# Patient Record
Sex: Male | Born: 1965 | Race: White | Hispanic: No | Marital: Married | Smoking: Never smoker
Health system: Southern US, Community
[De-identification: ages and names within clinical notes are randomized; demographics above are authoritative.]

## PROBLEM LIST (undated history)

## (undated) DIAGNOSIS — G43909 Migraine, unspecified, not intractable, without status migrainosus: Secondary | ICD-10-CM

## (undated) DIAGNOSIS — N289 Disorder of kidney and ureter, unspecified: Secondary | ICD-10-CM

## (undated) HISTORY — PX: HERNIA REPAIR: SHX51

## (undated) HISTORY — PX: CHOLECYSTECTOMY: SHX55

## (undated) HISTORY — PX: GASTRIC BYPASS: SHX52

---

## 2004-12-06 ENCOUNTER — Emergency Department (HOSPITAL_COMMUNITY): Admission: EM | Admit: 2004-12-06 | Discharge: 2004-12-06 | Payer: Self-pay | Admitting: Emergency Medicine

## 2005-12-09 ENCOUNTER — Ambulatory Visit (HOSPITAL_COMMUNITY): Admission: RE | Admit: 2005-12-09 | Discharge: 2005-12-09 | Payer: Self-pay | Admitting: Family Medicine

## 2005-12-21 ENCOUNTER — Ambulatory Visit (HOSPITAL_COMMUNITY): Admission: RE | Admit: 2005-12-21 | Discharge: 2005-12-21 | Payer: Self-pay | Admitting: Family Medicine

## 2006-06-27 ENCOUNTER — Emergency Department (HOSPITAL_COMMUNITY): Admission: EM | Admit: 2006-06-27 | Discharge: 2006-06-27 | Payer: Self-pay | Admitting: Family Medicine

## 2007-12-19 ENCOUNTER — Emergency Department (HOSPITAL_COMMUNITY): Admission: EM | Admit: 2007-12-19 | Discharge: 2007-12-19 | Payer: Self-pay | Admitting: Emergency Medicine

## 2008-04-04 ENCOUNTER — Emergency Department (HOSPITAL_COMMUNITY): Admission: EM | Admit: 2008-04-04 | Discharge: 2008-04-04 | Payer: Self-pay | Admitting: Emergency Medicine

## 2008-04-09 ENCOUNTER — Ambulatory Visit (HOSPITAL_COMMUNITY): Admission: RE | Admit: 2008-04-09 | Discharge: 2008-04-09 | Payer: Self-pay | Admitting: Family Medicine

## 2010-09-12 ENCOUNTER — Emergency Department (HOSPITAL_COMMUNITY): Admission: EM | Admit: 2010-09-12 | Discharge: 2010-09-12 | Payer: Self-pay | Admitting: Emergency Medicine

## 2011-01-26 LAB — URINALYSIS, ROUTINE W REFLEX MICROSCOPIC
Bilirubin Urine: NEGATIVE
Glucose, UA: NEGATIVE mg/dL
Hgb urine dipstick: NEGATIVE
Ketones, ur: NEGATIVE mg/dL
Nitrite: NEGATIVE
Protein, ur: NEGATIVE mg/dL
Specific Gravity, Urine: 1.015 (ref 1.005–1.030)
Urobilinogen, UA: 0.2 mg/dL (ref 0.0–1.0)
pH: 7 (ref 5.0–8.0)

## 2011-01-26 LAB — CBC
HCT: 44.2 % (ref 39.0–52.0)
Hemoglobin: 14.5 g/dL (ref 13.0–17.0)
MCH: 30.9 pg (ref 26.0–34.0)
MCHC: 32.8 g/dL (ref 30.0–36.0)
MCV: 94.1 fL (ref 78.0–100.0)
Platelets: 232 10*3/uL (ref 150–400)
RBC: 4.69 MIL/uL (ref 4.22–5.81)
RDW: 13.5 % (ref 11.5–15.5)
WBC: 6.8 10*3/uL (ref 4.0–10.5)

## 2011-01-26 LAB — DIFFERENTIAL
Basophils Absolute: 0.1 10*3/uL (ref 0.0–0.1)
Basophils Relative: 1 % (ref 0–1)
Eosinophils Absolute: 0.1 10*3/uL (ref 0.0–0.7)
Eosinophils Relative: 2 % (ref 0–5)
Lymphocytes Relative: 44 % (ref 12–46)
Lymphs Abs: 3 10*3/uL (ref 0.7–4.0)
Monocytes Absolute: 0.7 10*3/uL (ref 0.1–1.0)
Monocytes Relative: 10 % (ref 3–12)
Neutro Abs: 3 10*3/uL (ref 1.7–7.7)
Neutrophils Relative %: 43 % (ref 43–77)

## 2011-01-26 LAB — BASIC METABOLIC PANEL
BUN: 11 mg/dL (ref 6–23)
CO2: 27 mEq/L (ref 19–32)
Calcium: 9.6 mg/dL (ref 8.4–10.5)
Chloride: 97 mEq/L (ref 96–112)
Creatinine, Ser: 1.03 mg/dL (ref 0.4–1.5)
GFR calc Af Amer: >60 mL/min (ref >60)
GFR calc non Af Amer: >60 mL/min (ref >60)
Glucose, Bld: 112 mg/dL — ABNORMAL HIGH (ref 70–99)
Potassium: 3.8 mEq/L (ref 3.5–5.1)
Sodium: 132 mEq/L — ABNORMAL LOW (ref 135–145)

## 2011-08-10 LAB — DIFFERENTIAL
Basophils Absolute: 0
Basophils Relative: 0
Eosinophils Absolute: 0.1
Eosinophils Relative: 1
Lymphocytes Relative: 20
Lymphs Abs: 2.9
Monocytes Absolute: 1
Monocytes Relative: 7
Neutro Abs: 10.3 — ABNORMAL HIGH
Neutrophils Relative %: 72

## 2011-08-10 LAB — CBC
HCT: 37.2 — ABNORMAL LOW
Hemoglobin: 13.1
MCHC: 35.3
MCV: 88.8
Platelets: 254
RBC: 4.19 — ABNORMAL LOW
RDW: 12.9
WBC: 14.3 — ABNORMAL HIGH

## 2011-08-10 LAB — BASIC METABOLIC PANEL
BUN: 15
CO2: 24
Calcium: 9
Chloride: 104
Creatinine, Ser: 0.94
GFR calc Af Amer: >60
GFR calc non Af Amer: >60
Glucose, Bld: 144 — ABNORMAL HIGH
Potassium: 3.4 — ABNORMAL LOW
Sodium: 135

## 2012-01-30 ENCOUNTER — Encounter (HOSPITAL_COMMUNITY): Payer: Self-pay | Admitting: Emergency Medicine

## 2012-01-30 ENCOUNTER — Emergency Department (HOSPITAL_COMMUNITY): Payer: 59

## 2012-01-30 ENCOUNTER — Emergency Department (HOSPITAL_COMMUNITY)
Admission: EM | Admit: 2012-01-30 | Discharge: 2012-01-30 | Disposition: A | Discharge status: Home / Self Care | Payer: 59 | Attending: Emergency Medicine | Admitting: Emergency Medicine

## 2012-01-30 ENCOUNTER — Other Ambulatory Visit: Payer: Self-pay

## 2012-01-30 DIAGNOSIS — R4789 Other speech disturbances: Secondary | ICD-10-CM | POA: Insufficient documentation

## 2012-01-30 DIAGNOSIS — Z9884 Bariatric surgery status: Secondary | ICD-10-CM | POA: Insufficient documentation

## 2012-01-30 DIAGNOSIS — R4701 Aphasia: Secondary | ICD-10-CM | POA: Insufficient documentation

## 2012-01-30 DIAGNOSIS — R209 Unspecified disturbances of skin sensation: Secondary | ICD-10-CM | POA: Insufficient documentation

## 2012-01-30 DIAGNOSIS — G43909 Migraine, unspecified, not intractable, without status migrainosus: Secondary | ICD-10-CM | POA: Insufficient documentation

## 2012-01-30 DIAGNOSIS — H547 Unspecified visual loss: Secondary | ICD-10-CM | POA: Insufficient documentation

## 2012-01-30 HISTORY — DX: Migraine, unspecified, not intractable, without status migrainosus: G43.909

## 2012-01-30 HISTORY — DX: Disorder of kidney and ureter, unspecified: N28.9

## 2012-01-30 LAB — DIFFERENTIAL
Basophils Absolute: 0 10*3/uL (ref 0.0–0.1)
Basophils Relative: 1 % (ref 0–1)
Eosinophils Absolute: 0.1 10*3/uL (ref 0.0–0.7)
Eosinophils Relative: 1 % (ref 0–5)
Lymphs Abs: 2.5 10*3/uL (ref 0.7–4.0)
Monocytes Absolute: 0.6 10*3/uL (ref 0.1–1.0)
Monocytes Relative: 12 % (ref 3–12)
Neutro Abs: 2.3 10*3/uL (ref 1.7–7.7)
Neutrophils Relative %: 42 % — ABNORMAL LOW (ref 43–77)

## 2012-01-30 LAB — BASIC METABOLIC PANEL
BUN: 9 mg/dL (ref 6–23)
CO2: 29 mEq/L (ref 19–32)
Calcium: 9.3 mg/dL (ref 8.4–10.5)
Chloride: 105 mEq/L (ref 96–112)
Creatinine, Ser: 0.9 mg/dL (ref 0.50–1.35)
GFR calc non Af Amer: >90 mL/min (ref >90)
Glucose, Bld: 100 mg/dL — ABNORMAL HIGH (ref 70–99)
Potassium: 3.4 mEq/L — ABNORMAL LOW (ref 3.5–5.1)
Sodium: 140 mEq/L (ref 135–145)

## 2012-01-30 LAB — CBC
HCT: 39 % (ref 39.0–52.0)
Hemoglobin: 13.4 g/dL (ref 13.0–17.0)
MCH: 31.1 pg (ref 26.0–34.0)
MCHC: 34.4 g/dL (ref 30.0–36.0)
MCV: 90.5 fL (ref 78.0–100.0)
Platelets: 199 10*3/uL (ref 150–400)
RBC: 4.31 MIL/uL (ref 4.22–5.81)
RDW: 12.5 % (ref 11.5–15.5)
WBC: 5.6 10*3/uL (ref 4.0–10.5)

## 2012-01-30 MED ORDER — SODIUM CHLORIDE 0.9 % IV BOLUS (SEPSIS)
1000.0000 mL | Freq: Once | INTRAVENOUS | Status: AC
  Administered 2012-01-30: 1000 mL via INTRAVENOUS

## 2012-01-30 MED ORDER — PROMETHAZINE HCL 25 MG/ML IJ SOLN
12.5000 mg | Freq: Once | INTRAMUSCULAR | Status: AC
  Administered 2012-01-30: 12.5 mg via INTRAVENOUS
  Filled 2012-01-30: qty 1

## 2012-01-30 MED ORDER — SODIUM CHLORIDE 0.9 % IV SOLN
INTRAVENOUS | Status: DC
  Administered 2012-01-30: 16:00:00 via INTRAVENOUS

## 2012-01-30 MED ORDER — HYDROMORPHONE HCL PF 1 MG/ML IJ SOLN
1.0000 mg | Freq: Once | INTRAMUSCULAR | Status: AC
  Administered 2012-01-30: 1 mg via INTRAVENOUS
  Filled 2012-01-30: qty 1

## 2012-01-30 NOTE — Discharge Instructions (Signed)
Migraine Headache  A migraine is very bad pain on one or both sides of your head. The cause of a migraine is not always known. A migraine can be triggered or caused by different things, such as:   Alcohol.   Smoking.   Stress.   Periods (menstruation) in women.   Aged cheeses.   Foods or drinks that contain nitrates, glutamate, aspartame, or tyramine.   Lack of sleep.   Chocolate.   Caffeine.   Hunger.   Medicines, such as nitroglycerine (used to treat chest pain), birth control pills, estrogen, and some blood pressure medicines.  HOME CARE   Many medicines can help migraine pain or keep migraines from coming back. Your doctor can help you decide on a medicine or treatment program.   If you or your child gets a migraine, it may help to lie down in a dark, quiet room.   Keep a headache journal. This may help find out what is causing the headaches. For example, write down:   What you eat and drink.   How much sleep you get.   Any change to your diet or medicines.  GET HELP RIGHT AWAY IF:    The medicine does not work.   The pain begins again.   The neck is stiff.   You have trouble seeing.   The muscles are weak or you lose muscle control.   You have new symptoms.   You lose your balance.   You have trouble walking.   You feel faint or pass out.  MAKE SURE YOU:    Understand these instructions.   Will watch this condition.   Will get help right away if you are not doing well or get worse.  Document Released: 08/09/2008 Document Revised: 10/20/2011 Document Reviewed: 07/06/2009  [employer] Patient Information 2012 [employer].

## 2012-01-30 NOTE — ED Notes (Signed)
Pt c/o headache with numbness to hands, no peripheral vision, and slurred speech since 1030 today. Pt states he has a history of migraines.

## 2012-01-30 NOTE — ED Provider Notes (Signed)
History   This chart was scribed for Shelda Jakes, MD by Sofie Rower. The patient was seen in room APA19/APA19 and the patient's care was started at 3:41PM.    CSN: [id number]  Arrival date & time 01/30/12  1358   First MD Initiated Contact with Patient 01/30/12 1515      Chief Complaint  Patient presents with  . Numbness  . Headache  . Aphasia    (Consider location/radiation/quality/duration/timing/severity/associated sxs/prior treatment) HPI  [name] [name] is a 46 y.o. male who presents to the Emergency Department complaining of moderate, constant headache located on the left side back of head, onset today (10:30AM) with associated symptoms of numbness to the hands, slurred speech. Pt states " his hands began to tingle, he lost vision and the left side of his head started pounding". Modifying factors include taking dilaudid (which he was given last episode), morphine with relief. Pt has a hx of migraines (1987) went to the Neurologist in [place], [place]. Pt states he experiences similar symptoms like these which happen 2-3 times a year. 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   Pt denies inability to think clearly, nausea, vomiting, fever, back pain, chest pain, abd pain, shortness of breath, sore throat, diarrhea, dysuria, rash, swelling in the legs.   Pt is allergic to codeine.   PCP is Dr. Phillips Odor. Pt does not have a nuero at present.   Past Medical History  Diagnosis Date  . Renal disorder   . Migraines     Past Surgical History  Procedure Date  . Gastric bypass     History reviewed. No pertinent family history.  History  Substance Use Topics  . Smoking status: Never Smoker   . Smokeless tobacco: Not on file  . Alcohol Use: Yes      Review of Systems  All other systems reviewed and are negative.    10 Systems reviewed and are negative for acute change except as noted in the HPI.   Allergies  Codeine  Home Medications   Current Outpatient  Rx  Name Route Sig Dispense Refill  . CALCIUM PO Oral Take 1 tablet by mouth 2 (two) times daily.    . CYANOCOBALAMIN 1000 MCG SL SUBL Sublingual Place 2 tablets under the tongue daily.    . IRON PO Oral Take 1 tablet by mouth daily.    . ADULT MULTIVITAMIN W/MINERALS CH Oral Take 1 tablet by mouth daily.      BP 129/76  Pulse 43  Resp 21  Ht 5\' 7"  (1.702 m)  Wt 179 lb (81.194 kg)  BMI 28.04 kg/m2  SpO2 100%  Physical Exam  Nursing note and vitals reviewed. Constitutional: He is oriented to person, place, and time. He appears well-developed and well-nourished.  HENT:  Head: Normocephalic and atraumatic.  Nose: Nose normal.  Eyes: Conjunctivae are normal. Right eye exhibits no discharge. Left eye exhibits no discharge. No scleral icterus.       Pupils 3 cm symmetrical.   Neck: Neck supple. No thyromegaly present.  Cardiovascular: Normal rate, regular rhythm and normal heart sounds.  Exam reveals no gallop and no friction rub.   No murmur heard. Pulmonary/Chest: Breath sounds normal. No stridor. He has no wheezes. He has no rales. He exhibits no tenderness.  Abdominal: Bowel sounds are normal. He exhibits no distension. There is no tenderness. There is no rebound and no guarding.  Musculoskeletal: Normal range of motion. He exhibits no edema.  Lymphadenopathy:  He has no cervical adenopathy.  Neurological: He is alert and oriented to person, place, and time. Coordination normal.  Skin: Skin is warm and dry. No rash noted. No erythema.  Psychiatric: He has a normal mood and affect. His behavior is normal.    ED Course  Procedures (including critical care time)  DIAGNOSTIC STUDIES: Oxygen Saturation is 100% on room air, normal by my interpretation.    COORDINATION OF CARE:  Results for orders placed during the hospital encounter of 01/30/12  BASIC METABOLIC PANEL      Component Value Range   Sodium 140  135 - 145 (mEq/L)   Potassium 3.4 (*) 3.5 - 5.1 (mEq/L)   Chloride  105  96 - 112 (mEq/L)   CO2 29  19 - 32 (mEq/L)   Glucose, Bld 100 (*) 70 - 99 (mg/dL)   BUN 9  6 - 23 (mg/dL)   Creatinine, Ser 1.61  0.50 - 1.35 (mg/dL)   Calcium 9.3  8.4 - 09.6 (mg/dL)   GFR calc non Af Amer >90  >90 (mL/min)   GFR calc Af Amer >90  >90 (mL/min)  CBC      Component Value Range   WBC 5.6  4.0 - 10.5 (K/uL)   RBC 4.31  4.22 - 5.81 (MIL/uL)   Hemoglobin 13.4  13.0 - 17.0 (g/dL)   HCT 04.5  40.9 - 81.1 (%)   MCV 90.5  78.0 - 100.0 (fL)   MCH 31.1  26.0 - 34.0 (pg)   MCHC 34.4  30.0 - 36.0 (g/dL)   RDW 91.4  78.2 - 95.6 (%)   Platelets 199  150 - 400 (K/uL)  DIFFERENTIAL      Component Value Range   Neutrophils Relative 42 (*) 43 - 77 (%)   Neutro Abs 2.3  1.7 - 7.7 (K/uL)   Lymphocytes Relative 45  12 - 46 (%)   Lymphs Abs 2.5  0.7 - 4.0 (K/uL)   Monocytes Relative 12  3 - 12 (%)   Monocytes Absolute 0.6  0.1 - 1.0 (K/uL)   Eosinophils Relative 1  0 - 5 (%)   Eosinophils Absolute 0.1  0.0 - 0.7 (K/uL)   Basophils Relative 1  0 - 1 (%)   Basophils Absolute 0.0  0.0 - 0.1 (K/uL)   Ct Head Wo Contrast  01/30/2012  *RADIOLOGY REPORT*  Clinical Data:  Migraine headaches since 10:30 a.m. Photophobia.  CT HEAD WITHOUT CONTRAST  Technique:  Contiguous axial images were obtained from the base of the skull through the vertex without contrast  Comparison:  None.  Findings:  The brain has a normal appearance without evidence for hemorrhage, acute infarction, hydrocephalus, or mass lesion.  There is no extra axial fluid collection.  The skull and paranasal sinuses are normal.  IMPRESSION: Normal CT of the head without contrast.  Original Report Authenticated By: Elsie Stain, M.D.     Labs Reviewed  BASIC METABOLIC PANEL - Abnormal; Notable for the following:    Potassium 3.4 (*)    Glucose, Bld 100 (*)    All other components within normal limits  DIFFERENTIAL - Abnormal; Notable for the following:    Neutrophils Relative 42 (*)    All other components within normal  limits  CBC   Ct Head Wo Contrast  01/30/2012  *RADIOLOGY REPORT*  Clinical Data:  Migraine headaches since 10:30 a.m. Photophobia.  CT HEAD WITHOUT CONTRAST  Technique:  Contiguous axial images were obtained from the base of  the skull through the vertex without contrast  Comparison:  None.  Findings:  The brain has a normal appearance without evidence for hemorrhage, acute infarction, hydrocephalus, or mass lesion.  There is no extra axial fluid collection.  The skull and paranasal sinuses are normal.  IMPRESSION: Normal CT of the head without contrast.  Original Report Authenticated By: Elsie Stain, M.D.   Results for orders placed during the hospital encounter of 01/30/12  BASIC METABOLIC PANEL      Component Value Range   Sodium 140  135 - 145 (mEq/L)   Potassium 3.4 (*) 3.5 - 5.1 (mEq/L)   Chloride 105  96 - 112 (mEq/L)   CO2 29  19 - 32 (mEq/L)   Glucose, Bld 100 (*) 70 - 99 (mg/dL)   BUN 9  6 - 23 (mg/dL)   Creatinine, Ser 1.61  0.50 - 1.35 (mg/dL)   Calcium 9.3  8.4 - 09.6 (mg/dL)   GFR calc non Af Amer >90  >90 (mL/min)   GFR calc Af Amer >90  >90 (mL/min)  CBC      Component Value Range   WBC 5.6  4.0 - 10.5 (K/uL)   RBC 4.31  4.22 - 5.81 (MIL/uL)   Hemoglobin 13.4  13.0 - 17.0 (g/dL)   HCT 04.5  40.9 - 81.1 (%)   MCV 90.5  78.0 - 100.0 (fL)   MCH 31.1  26.0 - 34.0 (pg)   MCHC 34.4  30.0 - 36.0 (g/dL)   RDW 91.4  78.2 - 95.6 (%)   Platelets 199  150 - 400 (K/uL)  DIFFERENTIAL      Component Value Range   Neutrophils Relative 42 (*) 43 - 77 (%)   Neutro Abs 2.3  1.7 - 7.7 (K/uL)   Lymphocytes Relative 45  12 - 46 (%)   Lymphs Abs 2.5  0.7 - 4.0 (K/uL)   Monocytes Relative 12  3 - 12 (%)   Monocytes Absolute 0.6  0.1 - 1.0 (K/uL)   Eosinophils Relative 1  0 - 5 (%)   Eosinophils Absolute 0.1  0.0 - 0.7 (K/uL)   Basophils Relative 1  0 - 1 (%)   Basophils Absolute 0.0  0.0 - 0.1 (K/uL)    Date: 01/30/2012  Rate: 41  Rhythm: sinus bradycardia  QRS Axis: normal   Intervals: normal  ST/T Wave abnormalities: nonspecific T wave changes  Conduction Disutrbances:none  Narrative Interpretation:   Old EKG Reviewed: none available    1. Migraine     3:41PM- EDP at bedside discusses treatment plan concerning complicated migraines and cat scan, IV fluids, pain management, anti nausea medication.   5:22PM- Recheck. EDP at bedside, pt reports "headache has been relieved".   MDM   History date of migraines today's migraine is typical for his migraines including the focal findings. Head CT was done just to be cautious it was negative without any acute findings basic labs without any significant abnormalities. Migraine headache completely relieved with Phenergan and Dilaudid IV patient feeling much better will discharge home and give referral to neurology for further followup as migraine headaches.     I personally performed the services described in this documentation, which was scribed in my presence. The recorded information has been reviewed and considered.     Shelda Jakes, MD 01/30/12 [phone]

## 2012-03-29 ENCOUNTER — Emergency Department (HOSPITAL_COMMUNITY): Payer: 59

## 2012-03-29 ENCOUNTER — Emergency Department (HOSPITAL_COMMUNITY)
Admission: EM | Admit: 2012-03-29 | Discharge: 2012-03-29 | Disposition: A | Discharge status: Home / Self Care | Payer: 59 | Attending: Emergency Medicine | Admitting: Emergency Medicine

## 2012-03-29 ENCOUNTER — Encounter (HOSPITAL_COMMUNITY): Payer: Self-pay | Admitting: *Deleted

## 2012-03-29 DIAGNOSIS — Z9884 Bariatric surgery status: Secondary | ICD-10-CM | POA: Insufficient documentation

## 2012-03-29 DIAGNOSIS — R1032 Left lower quadrant pain: Secondary | ICD-10-CM | POA: Insufficient documentation

## 2012-03-29 DIAGNOSIS — R1031 Right lower quadrant pain: Secondary | ICD-10-CM | POA: Insufficient documentation

## 2012-03-29 DIAGNOSIS — R112 Nausea with vomiting, unspecified: Secondary | ICD-10-CM | POA: Insufficient documentation

## 2012-03-29 DIAGNOSIS — R1012 Left upper quadrant pain: Secondary | ICD-10-CM | POA: Insufficient documentation

## 2012-03-29 DIAGNOSIS — R1011 Right upper quadrant pain: Secondary | ICD-10-CM | POA: Insufficient documentation

## 2012-03-29 DIAGNOSIS — M549 Dorsalgia, unspecified: Secondary | ICD-10-CM | POA: Insufficient documentation

## 2012-03-29 LAB — COMPREHENSIVE METABOLIC PANEL
ALT: 26 U/L (ref 0–53)
Alkaline Phosphatase: 108 U/L (ref 39–117)
CO2: 21 mEq/L (ref 19–32)
Chloride: 98 mEq/L (ref 96–112)
GFR calc Af Amer: >90 mL/min (ref >90)
GFR calc non Af Amer: >90 mL/min (ref >90)
Glucose, Bld: 125 mg/dL — ABNORMAL HIGH (ref 70–99)
Potassium: 3.6 mEq/L (ref 3.5–5.1)
Sodium: 137 mEq/L (ref 135–145)

## 2012-03-29 LAB — CBC
MCV: 89.4 fL (ref 78.0–100.0)
Platelets: 244 10*3/uL (ref 150–400)
RBC: 4.83 MIL/uL (ref 4.22–5.81)
WBC: 7.1 10*3/uL (ref 4.0–10.5)

## 2012-03-29 LAB — URINALYSIS, ROUTINE W REFLEX MICROSCOPIC
Hgb urine dipstick: NEGATIVE
Nitrite: NEGATIVE
Specific Gravity, Urine: 1.02 (ref 1.005–1.030)
Urobilinogen, UA: 0.2 mg/dL (ref 0.0–1.0)

## 2012-03-29 LAB — DIFFERENTIAL
Lymphocytes Relative: 42 % (ref 12–46)
Lymphs Abs: 3 10*3/uL (ref 0.7–4.0)
Neutrophils Relative %: 48 % (ref 43–77)

## 2012-03-29 MED ORDER — FENTANYL CITRATE 0.05 MG/ML IJ SOLN
INTRAMUSCULAR | Status: AC
  Administered 2012-03-29: 100 ug
  Filled 2012-03-29: qty 2

## 2012-03-29 MED ORDER — HYDROMORPHONE HCL PF 1 MG/ML IJ SOLN
1.0000 mg | INTRAMUSCULAR | Status: DC | PRN
  Administered 2012-03-29: 1 mg via INTRAVENOUS
  Filled 2012-03-29: qty 1

## 2012-03-29 MED ORDER — OXYCODONE-ACETAMINOPHEN 5-325 MG PO TABS: 1.0000 | ORAL_TABLET | ORAL | Status: DC | PRN

## 2012-03-29 MED ORDER — FENTANYL CITRATE 0.05 MG/ML IJ SOLN
100.0000 ug | Freq: Once | INTRAMUSCULAR | Status: AC
  Administered 2012-03-29: 100 ug via INTRAVENOUS
  Filled 2012-03-29: qty 2

## 2012-03-29 MED ORDER — SODIUM CHLORIDE 0.9 % IV BOLUS (SEPSIS)
1000.0000 mL | Freq: Once | INTRAVENOUS | Status: AC
  Administered 2012-03-29: 1000 mL via INTRAVENOUS

## 2012-03-29 MED ORDER — ONDANSETRON HCL 4 MG/2ML IJ SOLN
INTRAMUSCULAR | Status: AC
  Filled 2012-03-29: qty 2

## 2012-03-29 MED ORDER — OXYCODONE-ACETAMINOPHEN 5-325 MG PO TABS: 1.0000 | ORAL_TABLET | ORAL | Status: AC | PRN

## 2012-03-29 MED ORDER — ONDANSETRON HCL 4 MG/2ML IJ SOLN
4.0000 mg | Freq: Once | INTRAMUSCULAR | Status: AC
  Administered 2012-03-29: 4 mg via INTRAVENOUS

## 2012-03-29 MED ORDER — SODIUM CHLORIDE 0.9 % IV BOLUS (SEPSIS): 1000.0000 mL | Freq: Once | INTRAVENOUS | Status: DC

## 2012-03-29 MED ORDER — HYDROMORPHONE HCL PF 1 MG/ML IJ SOLN
1.0000 mg | Freq: Once | INTRAMUSCULAR | Status: AC
  Administered 2012-03-29: 1 mg via INTRAVENOUS
  Filled 2012-03-29: qty 1

## 2012-03-29 MED ORDER — ONDANSETRON HCL 4 MG/2ML IJ SOLN
4.0000 mg | Freq: Once | INTRAMUSCULAR | Status: AC
  Administered 2012-03-29: 4 mg via INTRAVENOUS
  Filled 2012-03-29: qty 2

## 2012-03-29 MED ORDER — ONDANSETRON HCL 4 MG/2ML IJ SOLN
INTRAMUSCULAR | Status: AC
  Administered 2012-03-29: 4 mg
  Filled 2012-03-29: qty 2

## 2012-03-29 NOTE — ED Notes (Signed)
Pt .signed d/c paper, computer not working

## 2012-03-29 NOTE — ED Notes (Signed)
C/o upper abd pain that started approx 2 hrs with nausea and dry heaving.  Also c/o upper back pain.

## 2012-03-29 NOTE — ED Notes (Signed)
Pt states he feels much better

## 2012-03-29 NOTE — ED Provider Notes (Addendum)
History  This chart was scribed for Joya Gaskins, MD by Stevphen Meuse. This patient was seen in room APA06/APA06 and the patient's care was started at 1:20PM. CSN: [id number]  Arrival date & time 03/29/12  1248   First MD Initiated Contact with Patient 03/29/12 1306      Chief Complaint  Patient presents with  . Abdominal Pain     Patient is a 46 y.o. male presenting with abdominal pain. The history is provided by the patient. No language interpreter was used.  Abdominal Pain The primary symptoms of the illness include abdominal pain, nausea and vomiting (dry heaving). The primary symptoms of the illness do not include fever. The current episode started 3 to 5 hours ago. The onset of the illness was sudden. The problem has been gradually worsening.  The abdominal pain began 3 to 5 hours ago. The pain came on suddenly. The abdominal pain has been rapidly worsening since its onset. The abdominal pain is located in the LUQ and RUQ. The abdominal pain radiates to the LLQ, RLQ and back. The abdominal pain is relieved by nothing. The abdominal pain is exacerbated by movement.  The patient has not had a change in bowel habit. Risk factors for an acute abdominal problem include a history of abdominal surgery (gastric bypass). Additional symptoms associated with the illness include back pain. Symptoms associated with the illness do not include constipation. Significant associated medical issues do not include diabetes, liver disease or substance abuse. Associated medical issues comments: migraines and renal disorder.    Past Medical History  Diagnosis Date  . Renal disorder   . Migraines     Past Surgical History  Procedure Date  . Gastric bypass     No family history on file.  History  Substance Use Topics  . Smoking status: Never Smoker   . Smokeless tobacco: Not on file  . Alcohol Use: Yes     occasional      Review of Systems  Constitutional: Negative for fever.    Gastrointestinal: Positive for nausea, vomiting (dry heaving) and abdominal pain. Negative for constipation.  Musculoskeletal: Positive for back pain.  All other systems reviewed and are negative.    Allergies  Codeine  Home Medications   Current Outpatient Rx  Name Route Sig Dispense Refill  . CALCIUM PO Oral Take 1 tablet by mouth 2 (two) times daily.    . CYANOCOBALAMIN 1000 MCG SL SUBL Sublingual Place 2 tablets under the tongue daily.    . IRON PO Oral Take 1 tablet by mouth daily.    . ADULT MULTIVITAMIN W/MINERALS CH Oral Take 1 tablet by mouth daily.      Triage Vitals: BP 139/76  Pulse 56  Temp(Src) 97.3 F (36.3 C) (Oral)  Resp 20  Ht 5' 7.5" (1.715 m)  Wt 176 lb (79.833 kg)  BMI 27.16 kg/m2  SpO2 99%  Physical Exam  Nursing note and vitals reviewed.  CONSTITUTIONAL: ill appearing and diaphoretic  HEAD AND FACE: Normocephalic/atraumatic EYES: EOMI/PERRL ENMT: Mucous membranes moist NECK: supple no meningeal signs SPINE:entire spine nontender CV: S1/S2 noted, no murmurs/rubs/gallops noted LUNGS: Lungs are clear to auscultation bilaterally, no apparent distress ABDOMEN: soft, epigastric tenderness, no rebound or guarding GU:no cva tenderness NEURO: Pt is awake/alert, moves all extremitiesx4 EXTREMITIES: pulses normal, full ROM SKIN: warm, color normal PSYCH: anxious  ED Course  Procedures   DIAGNOSTIC STUDIES: Oxygen Saturation is 99% on room air, normal by my interpretation.    COORDINATION OF  CARE:  1:25PM: discussed administering pain medication for his pain and  EKG and basic labs to check for abnormalities with pt and pt agreed.    3:06 PM Pt now localizing pain to back, similar to prior kidney stones but given his ill appearance, diaphoresis on presentation, will get ct imaging 4:21 PM Pt now localizing pain to epigastric region, will order ultrasound 6:06 PM PT MUCH IMPROVED TAKING PO FLUIDS US FINDINGS NOTED D/W DR MARK JENKINS ADVISED  OUTPATIENT HIDA SCAN HE WILL F/U ON RESULTS  THIS HAS BEEN CONFIRM FOR PATIENT TO CALL [phone] TOMORROW FOR STUDY DISCUSSED STRICT RETURN PRECAUTIONS   The patient appears reasonably screened and/or stabilized for discharge and I doubt any other medical condition or other EMC requiring further screening, evaluation, or treatment in the ED at this time prior to discharge.    Labs Reviewed  URINALYSIS, ROUTINE W REFLEX MICROSCOPIC      MDM  Nursing notes reviewed and considered in documentation All labs/vitals reviewed and considered Previous records reviewed and considered xrays reviewed and considered     Date: 03/29/2012  Rate: 46  Rhythm: sinus bradycardia  QRS Axis: normal  Intervals: normal  ST/T Wave abnormalities: nonspecific ST changes  Conduction Disutrbances:none  Narrative Interpretation:   Old EKG Reviewed: unchanged   Repeat EKG at 1559 unchanged from prior (consistent t wave abnormality similar to prior visits)     I personally performed the services described in this documentation, which was scribed in my presence. The recorded information has been reviewed and considered.      Joya Gaskins, MD 03/29/12 1807  Joya Gaskins, MD 03/29/12 Ebony Cargo

## 2012-03-29 NOTE — ED Notes (Signed)
Pt with sudden upper abd pain that radiates to back, hx of kidney stones

## 2012-03-29 NOTE — ED Notes (Signed)
Pt too restless for EKG to be done

## 2012-03-29 NOTE — ED Notes (Signed)
Water given  

## 2012-05-30 IMAGING — US US ABDOMEN COMPLETE
1 series · 13 of 25 positions shown · non-contrast
Comparison: CT abdomen pelvis dated 03/29/2012

CLINICAL DATA: Abdominal pain

COMPLETE ABDOMINAL ULTRASOUND

[Series 1: us abdomen complete · 0.31mm/px · 13 of 97 slices shown]
[im 1/97]
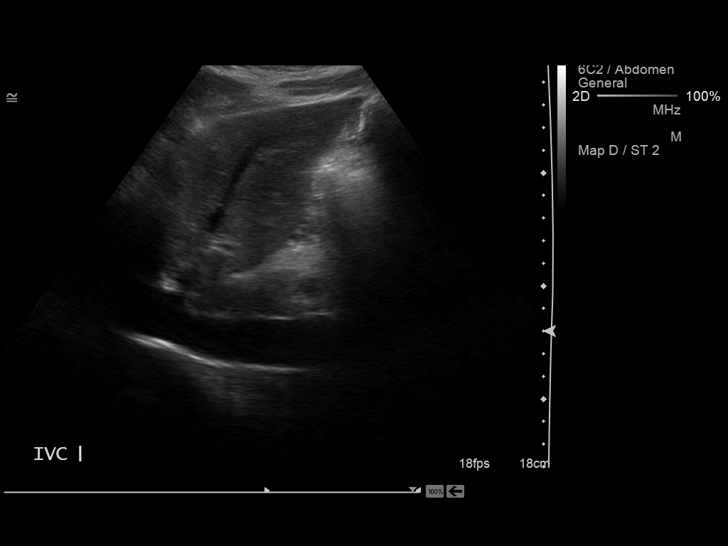
[im 9/97]
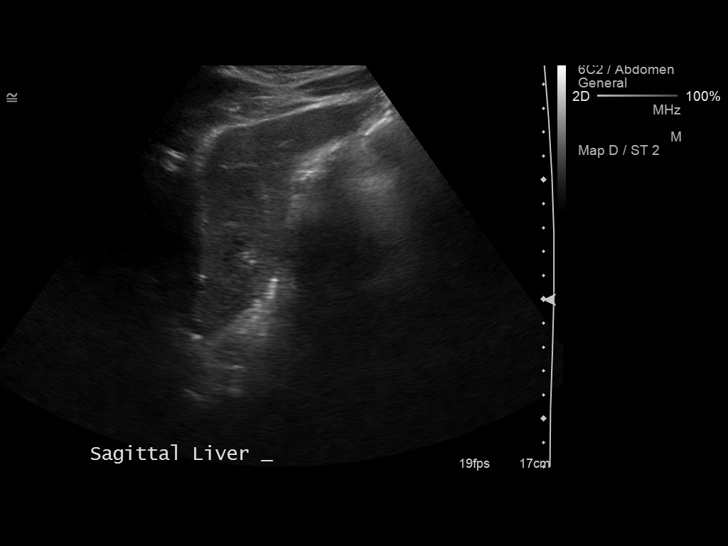
[im 17/97]
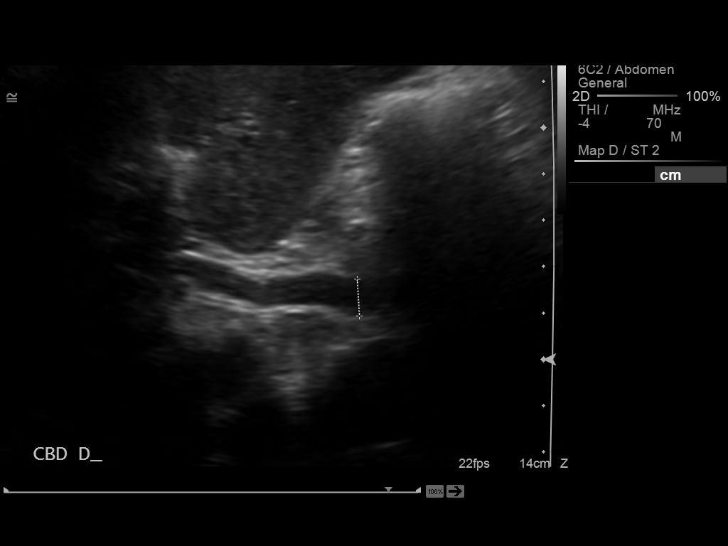
[im 25/97]
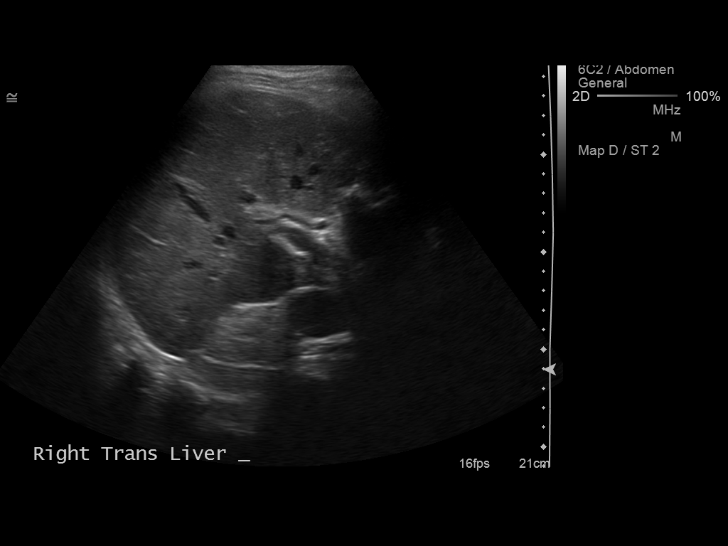
[im 33/97]
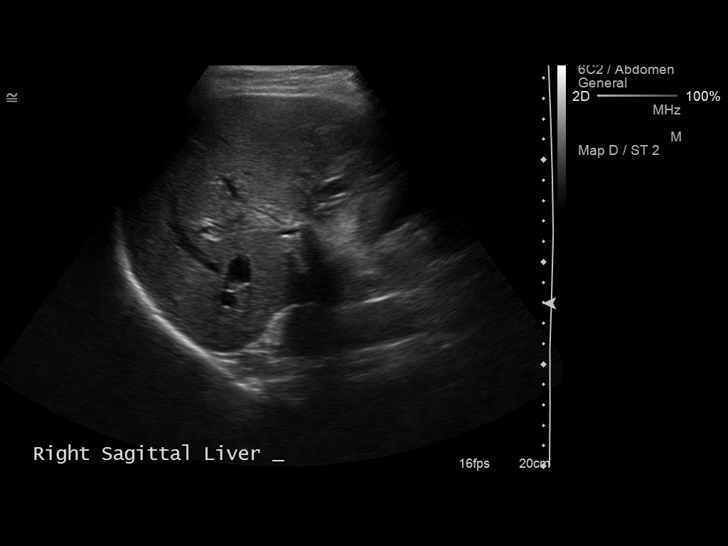
[im 41/97]
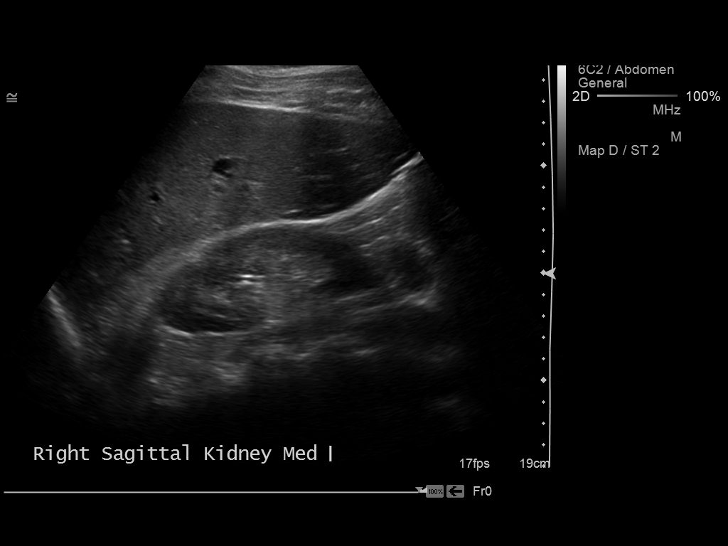
[im 49/97]
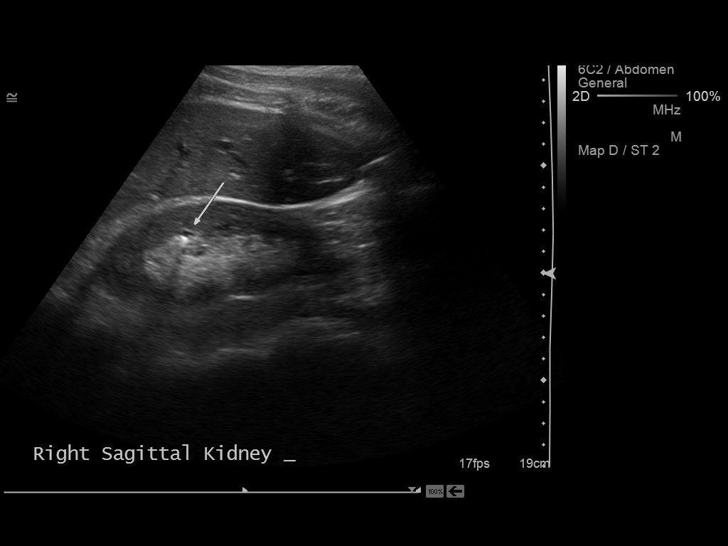
[im 57/97]
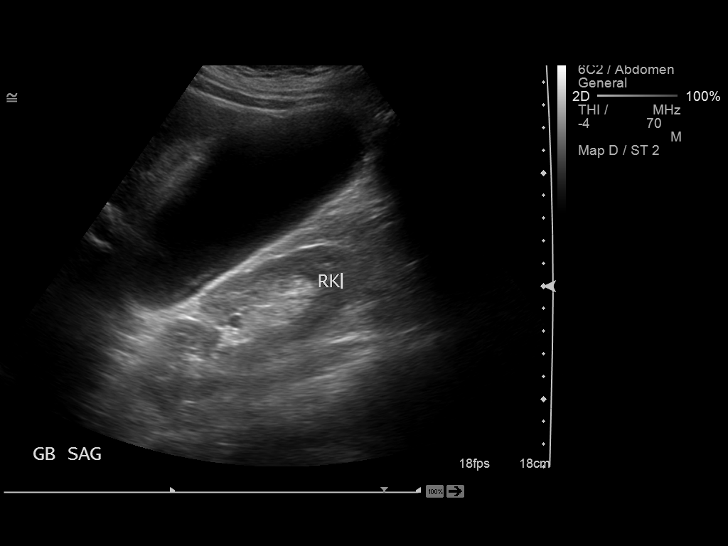
[im 65/97]
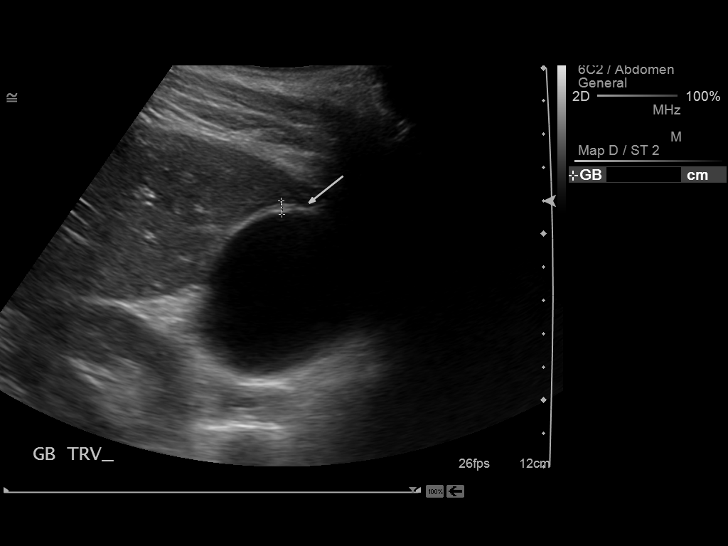
[im 73/97]
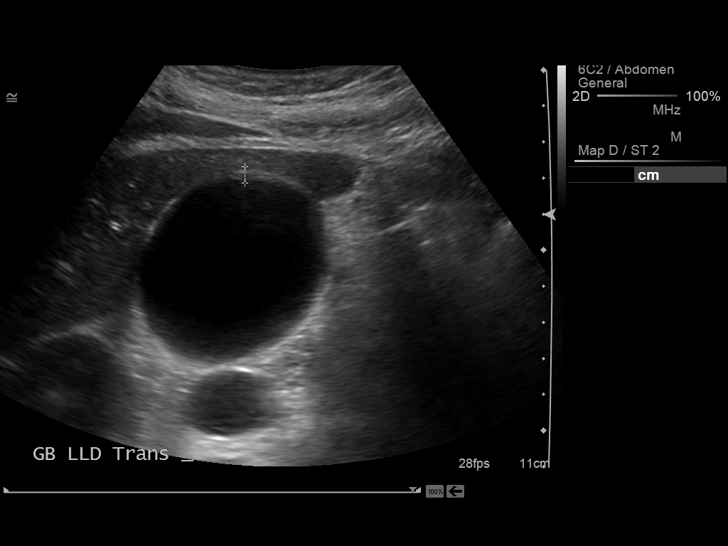
[im 81/97]
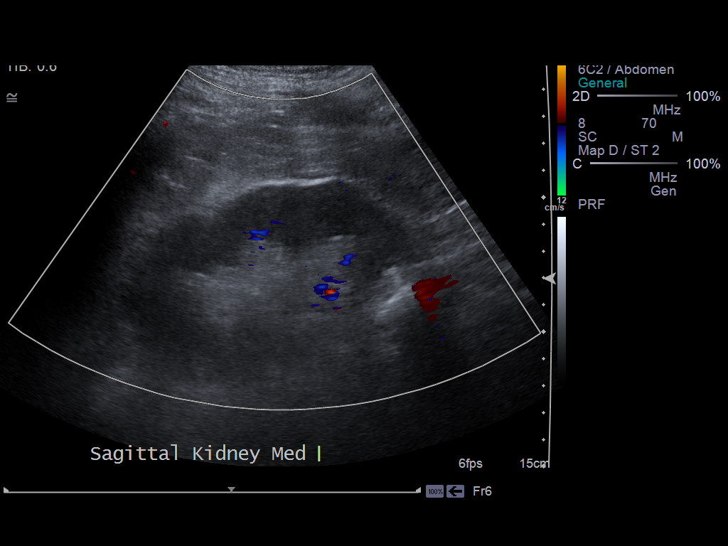
[im 89/97]
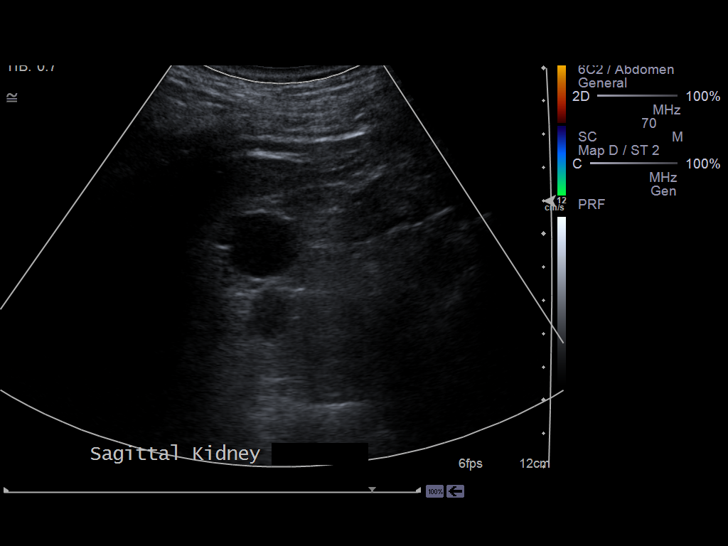
[im 97/97]
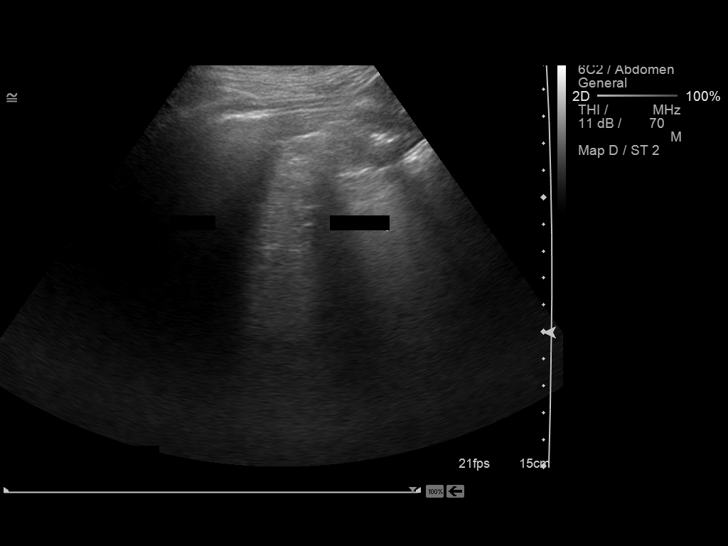

[13 of 25 positions shown; findings below may reference images not displayed]

FINDINGS: Gallbladder:  Mild gallbladder distension with sludge. Very mild
gallbladder wall thickening, measuring 4 mm.  No definite
pericholecystic fluid.  Negative sonographic Murphy's sign.

Common bile duct:  Measures 8 mm.

Liver:  No focal lesion identified.  Within normal limits in
parenchymal echogenicity.

IVC:  Appears normal.

Pancreas:  Poorly visualized due to overlying bowel gas.

Spleen:  Not visualized due to overlying bowel gas.

Right Kidney:  Measures 12.1 cm.  Small upper pole calculi, better
demonstrated on recent CT.  No hydronephrosis.

Left Kidney:  Measures 11.3 cm.  Known upper pole calculi are not
sonographically apparent.  2.0 and 1.4 cm upper pole cysts.  No
hydronephrosis.

Abdominal aorta:  No aneurysm identified.
IMPRESSION: Gallbladder distension with sludge.  Very mild gallbladder wall
thickening.  Negative sonographic Murphy's sign.  This appearance
is equivocal but considered unlikely to reflect acute
cholecystitis.  If symptoms persist, consider hepatobiliary nuclear
medicine scan for further evaluation.

Mild dilatation of the common bile duct, measuring 8 mm.

No cholelithiasis or choledocholithiasis is seen by ultrasound.

## 2013-11-30 ENCOUNTER — Emergency Department (HOSPITAL_COMMUNITY)
Admission: EM | Admit: 2013-11-30 | Discharge: 2013-11-30 | Disposition: A | Discharge status: Home / Self Care | Payer: 59 | Attending: Emergency Medicine | Admitting: Emergency Medicine

## 2013-11-30 ENCOUNTER — Encounter (HOSPITAL_COMMUNITY): Payer: Self-pay | Admitting: Emergency Medicine

## 2013-11-30 DIAGNOSIS — Z79899 Other long term (current) drug therapy: Secondary | ICD-10-CM | POA: Insufficient documentation

## 2013-11-30 DIAGNOSIS — R509 Fever, unspecified: Secondary | ICD-10-CM | POA: Insufficient documentation

## 2013-11-30 DIAGNOSIS — R000 Tachycardia, unspecified: Secondary | ICD-10-CM | POA: Insufficient documentation

## 2013-11-30 DIAGNOSIS — Z87448 Personal history of other diseases of urinary system: Secondary | ICD-10-CM | POA: Insufficient documentation

## 2013-11-30 DIAGNOSIS — R197 Diarrhea, unspecified: Secondary | ICD-10-CM | POA: Insufficient documentation

## 2013-11-30 DIAGNOSIS — G43909 Migraine, unspecified, not intractable, without status migrainosus: Secondary | ICD-10-CM | POA: Insufficient documentation

## 2013-11-30 DIAGNOSIS — R112 Nausea with vomiting, unspecified: Secondary | ICD-10-CM | POA: Insufficient documentation

## 2013-11-30 DIAGNOSIS — Z9884 Bariatric surgery status: Secondary | ICD-10-CM | POA: Insufficient documentation

## 2013-11-30 LAB — BASIC METABOLIC PANEL
BUN: 15 mg/dL (ref 6–23)
CO2: 18 mEq/L — ABNORMAL LOW (ref 19–32)
Calcium: 10 mg/dL (ref 8.4–10.5)
Chloride: 99 mEq/L (ref 96–112)
Creatinine, Ser: 0.94 mg/dL (ref 0.50–1.35)
Glucose, Bld: 144 mg/dL — ABNORMAL HIGH (ref 70–99)
POTASSIUM: 4.5 meq/L (ref 3.7–5.3)
SODIUM: 136 meq/L — AB (ref 137–147)

## 2013-11-30 LAB — CBC
HCT: 45 % (ref 39.0–52.0)
Hemoglobin: 16.8 g/dL (ref 13.0–17.0)
MCH: 32.4 pg (ref 26.0–34.0)
MCHC: 37.3 g/dL — AB (ref 30.0–36.0)
MCV: 86.9 fL (ref 78.0–100.0)
PLATELETS: 239 10*3/uL (ref 150–400)
RBC: 5.18 MIL/uL (ref 4.22–5.81)
RDW: 12.8 % (ref 11.5–15.5)
WBC: 14.3 10*3/uL — ABNORMAL HIGH (ref 4.0–10.5)

## 2013-11-30 MED ORDER — ONDANSETRON HCL 4 MG/2ML IJ SOLN
INTRAMUSCULAR | Status: AC
  Filled 2013-11-30: qty 2

## 2013-11-30 MED ORDER — SODIUM CHLORIDE 0.9 % IV BOLUS (SEPSIS)
1000.0000 mL | Freq: Once | INTRAVENOUS | Status: AC
  Administered 2013-11-30: 1000 mL via INTRAVENOUS

## 2013-11-30 MED ORDER — KETOROLAC TROMETHAMINE 30 MG/ML IJ SOLN
INTRAMUSCULAR | Status: AC
  Filled 2013-11-30: qty 1

## 2013-11-30 MED ORDER — ONDANSETRON HCL 4 MG PO TABS: 4.0000 mg | ORAL_TABLET | Freq: Four times a day (QID) | ORAL | Status: DC

## 2013-11-30 MED ORDER — ONDANSETRON HCL 4 MG/2ML IJ SOLN
4.0000 mg | Freq: Once | INTRAMUSCULAR | Status: AC
Start: 2013-11-30 — End: 2013-11-30
  Administered 2013-11-30: 4 mg via INTRAVENOUS
  Filled 2013-11-30: qty 2

## 2013-11-30 MED ORDER — KETOROLAC TROMETHAMINE 30 MG/ML IJ SOLN
30.0000 mg | Freq: Once | INTRAMUSCULAR | Status: AC
  Administered 2013-11-30: 30 mg via INTRAVENOUS

## 2013-11-30 MED ORDER — FENTANYL CITRATE 0.05 MG/ML IJ SOLN
50.0000 ug | Freq: Once | INTRAMUSCULAR | Status: AC
  Administered 2013-11-30: 50 ug via INTRAVENOUS
  Filled 2013-11-30: qty 2

## 2013-11-30 MED ORDER — ONDANSETRON HCL 4 MG/2ML IJ SOLN
4.0000 mg | Freq: Once | INTRAMUSCULAR | Status: AC
  Administered 2013-11-30: 4 mg via INTRAVENOUS

## 2013-11-30 MED ORDER — SODIUM CHLORIDE 0.9 % IV SOLN
1000.0000 mL | Freq: Once | INTRAVENOUS | Status: AC
  Administered 2013-11-30: 1000 mL via INTRAVENOUS

## 2013-11-30 MED ORDER — ACETAMINOPHEN 325 MG PO TABS
650.0000 mg | ORAL_TABLET | Freq: Once | ORAL | Status: AC
  Administered 2013-11-30: 650 mg via ORAL
  Filled 2013-11-30: qty 2

## 2013-11-30 NOTE — ED Notes (Signed)
Pt taking small sips of water w/ no vomiting at this time.

## 2013-11-30 NOTE — ED Notes (Signed)
Pt alert & oriented x4, stable gait. Patient  given discharge instructions, paperwork & prescription(s). Patient verbalized understanding. Pt left department w/ no further questions. 

## 2013-11-30 NOTE — ED Notes (Signed)
Pt reports nausea, & diarrhea that around 2330 yesterday. Pt denies vomiting but has had dry heaves.

## 2013-11-30 NOTE — ED Notes (Signed)
Pt in restroom w. Diarrhea & vomiting again.

## 2013-11-30 NOTE — ED Provider Notes (Signed)
CSN: [id number]     Arrival date & time 11/30/13  0234 History   First MD Initiated Contact with Patient 11/30/13 0245     Chief Complaint  Patient presents with  . Emesis  . Nausea  . Diarrhea   (Consider location/radiation/quality/duration/timing/severity/associated sxs/prior Treatment) HPI History provided by patient. Fever, chills, nausea and diarrhea rather sudden onset around 11:30 PM last night after going to bed. Has had some dry heaves but no emesis. No blood in stools. States multiple bouts of diarrhea. No sore throat, congestion, headache, cough or difficulty breathing. No recent travel. No known sick contacts. Symptoms moderate to severe. Some abdominal cramping intermittently the patient denies any significant abdominal pain. Past Medical History  Diagnosis Date  . Renal disorder   . Migraines    Past Surgical History  Procedure Laterality Date  . Gastric bypass    . Cholecystectomy    . Hernia repair     No family history on file. History  Substance Use Topics  . Smoking status: Never Smoker   . Smokeless tobacco: Not on file  . Alcohol Use: Yes     Comment: occasional    Review of Systems  Constitutional: Positive for fever and chills.  Eyes: Negative for visual disturbance.  Respiratory: Negative for shortness of breath.   Cardiovascular: Negative for chest pain.  Gastrointestinal: Positive for diarrhea. Negative for blood in stool.  Genitourinary: Negative for dysuria.  Musculoskeletal: Negative for back pain.  Skin: Negative for rash.  Neurological: Negative for headaches.  All other systems reviewed and are negative.    Allergies  Codeine  Home Medications   Current Outpatient Rx  Name  Route  Sig  Dispense  Refill  . butalbital-aspirin-caffeine (FIORINAL) 50-325-40 MG per capsule   Oral   Take 1-2 capsules by mouth every 6 (six) hours as needed. For migraines         . traMADol (ULTRAM) 50 MG tablet   Oral   Take 50 mg by mouth every 6  (six) hours as needed. For migraines         . calcium carbonate (OS-CAL) 600 MG TABS   Oral   Take 600 mg by mouth 3 (three) times daily with meals.         . Cyanocobalamin (B-12-SL) 1000 MCG SUBL   Sublingual   Place 1 tablet under the tongue daily.          . Ferrous Fumarate (IRON) 18 MG TBCR   Oral   Take 1 tablet by mouth daily.         . Multiple Vitamin (MULITIVITAMIN WITH MINERALS) TABS   Oral   Take 1 tablet by mouth daily.          BP 145/80  Pulse 102  Temp(Src) 99.7 F (37.6 C) (Oral)  Resp 22  Ht 5\' 7"  (1.702 m)  Wt 189 lb (85.73 kg)  BMI 29.59 kg/m2  SpO2 98% Physical Exam  Constitutional: He is oriented to person, place, and time. He appears well-developed and well-nourished.  HENT:  Head: Normocephalic and atraumatic.  Dry mucous membranes  Eyes: EOM are normal. Pupils are equal, round, and reactive to light. No scleral icterus.  Neck: Neck supple.  Cardiovascular: Regular rhythm and intact distal pulses.   Tachycardic  Pulmonary/Chest: Effort normal and breath sounds normal. No respiratory distress.  Abdominal: Soft. Bowel sounds are normal. He exhibits no distension. There is no tenderness.  Musculoskeletal: Normal range of motion. He exhibits no edema.  Neurological: He is alert and oriented to person, place, and time.  Skin: Skin is warm and dry.    ED Course  Procedures (including critical care time) Labs Review Labs Reviewed  CBC - Abnormal; Notable for the following:    WBC 14.3 (*)    MCHC 37.3 (*)    All other components within normal limits  BASIC METABOLIC PANEL - Abnormal; Notable for the following:    Sodium 136 (*)    CO2 18 (*)    Glucose, Bld 144 (*)    All other components within normal limits   Imaging Review No results found.  IV fluids 2L provided. IV Zofran x2. IV Toradol.   6:09 AM feeling somewhat better, is requesting something for abdominal cramping and feels comfortable with plan discharge home.  Tylenol and IV fentanyl provided.   Plan discharge with prescription for Zofran. Flu precautions verbalized is understood. Followup primary care physician or return here for any worsening condition.  MDM  Diagnosis: Fever, diarrhea  Labs obtained and reviewed as above, leukocytosis. Symptomatically improving with IV fluids and medications. Serial evaluations performed. No acute abdomen. Vital signs nurses notes reviewed and considered.    Teressa Lower, MD 11/30/13 [phone]

## 2013-11-30 NOTE — Discharge Instructions (Signed)
Fever, Adult A fever is a higher than normal body temperature. In an adult, an oral temperature around 98.6 F (37 C) is considered normal. A temperature of 100.4 F (38 C) or higher is generally considered a fever. Mild or moderate fevers generally have no long-term effects and often do not require treatment. Extreme fever (greater than or equal to 106 F or 41.1 C) can cause seizures. The sweating that may occur with repeated or prolonged fever may cause dehydration. Elderly people can develop confusion during a fever. A measured temperature can vary with:  Age.  Time of day.  Method of measurement (mouth, underarm, rectal, or ear). The fever is confirmed by taking a temperature with a thermometer. Temperatures can be taken different ways. Some methods are accurate and some are not.  An oral temperature is used most commonly. Electronic thermometers are fast and accurate.  An ear temperature will only be accurate if the thermometer is positioned as recommended by the manufacturer.  A rectal temperature is accurate and done for those adults who have a condition where an oral temperature cannot be taken.  An underarm (axillary) temperature is not accurate and not recommended. Fever is a symptom, not a disease.  CAUSES   Infections commonly cause fever.  Some noninfectious causes for fever include:  Some arthritis conditions.  Some thyroid or adrenal gland conditions.  Some immune system conditions.  Some types of cancer.  A medicine reaction.  High doses of certain street drugs such as methamphetamine.  Dehydration.  Exposure to high outside or room temperatures.  Occasionally, the source of a fever cannot be determined. This is sometimes called a "fever of unknown origin" (FUO).  Some situations may lead to a temporary rise in body temperature that may go away on its own. Examples are:  Childbirth.  Surgery.  Intense exercise. HOME CARE INSTRUCTIONS   Take  appropriate medicines for fever. Follow dosing instructions carefully. If you use acetaminophen to reduce the fever, be careful to avoid taking other medicines that also contain acetaminophen. Do not take aspirin for a fever if you are younger than age 19. There is an association with Reye's syndrome. Reye's syndrome is a rare but potentially deadly disease.  If an infection is present and antibiotics have been prescribed, take them as directed. Finish them even if you start to feel better.  Rest as needed.  Maintain an adequate fluid intake. To prevent dehydration during an illness with prolonged or recurrent fever, you may need to drink extra fluid.Drink enough fluids to keep your urine clear or pale yellow.  Sponging or bathing with room temperature water may help reduce body temperature. Do not use ice water or alcohol sponge baths.  Dress comfortably, but do not over-bundle. SEEK MEDICAL CARE IF:   You are unable to keep fluids down.  You develop vomiting or diarrhea.  You are not feeling at least partly better after 3 days.  You develop new symptoms or problems. SEEK IMMEDIATE MEDICAL CARE IF:   You have shortness of breath or trouble breathing.  You develop excessive weakness.  You are dizzy or you faint.  You are extremely thirsty or you are making little or no urine.  You develop new pain that was not there before (such as in the head, neck, chest, back, or abdomen).  You have persistant vomiting and diarrhea for more than 1 to 2 days.  You develop a stiff neck or your eyes become sensitive to light.  You develop a   skin rash.  You have a fever or persistent symptoms for more than 2 to 3 days.  You have a fever and your symptoms suddenly get worse. MAKE SURE YOU:   Understand these instructions.  Will watch your condition.  Will get help right away if you are not doing well or get worse. Document Released: 04/26/2001 Document Revised: 01/23/2012 Document  Reviewed: 09/01/2011 [employer] Patient Information 2014 [employer].  

## 2014-11-05 ENCOUNTER — Ambulatory Visit (HOSPITAL_COMMUNITY)
Admission: RE | Admit: 2014-11-05 | Discharge: 2014-11-05 | Disposition: A | Discharge status: Home / Self Care | Payer: 59 | Source: Ambulatory Visit | Attending: Family Medicine | Admitting: Family Medicine

## 2014-11-05 ENCOUNTER — Other Ambulatory Visit (HOSPITAL_COMMUNITY): Payer: Self-pay | Admitting: Family Medicine

## 2014-11-05 DIAGNOSIS — Z0000 Encounter for general adult medical examination without abnormal findings: Secondary | ICD-10-CM | POA: Diagnosis not present

## 2014-11-05 DIAGNOSIS — M545 Low back pain: Secondary | ICD-10-CM

## 2014-11-05 DIAGNOSIS — M4806 Spinal stenosis, lumbar region: Secondary | ICD-10-CM | POA: Insufficient documentation

## 2015-01-06 IMAGING — CR DG LUMBAR SPINE 2-3V
3 series · 3 of 3 positions shown · non-contrast
Comparison: 03/29/2012 CT abdomen and pelvis.

CLINICAL DATA: 48-year-old male with right-sided lower back pain
worsening over the past year. Pain extends to the right hip. No
known injury. No prior surgeries. Initial encounter.

EXAM:
LUMBAR SPINE - 2-3 VIEW

[view not recorded (1 of 3)]
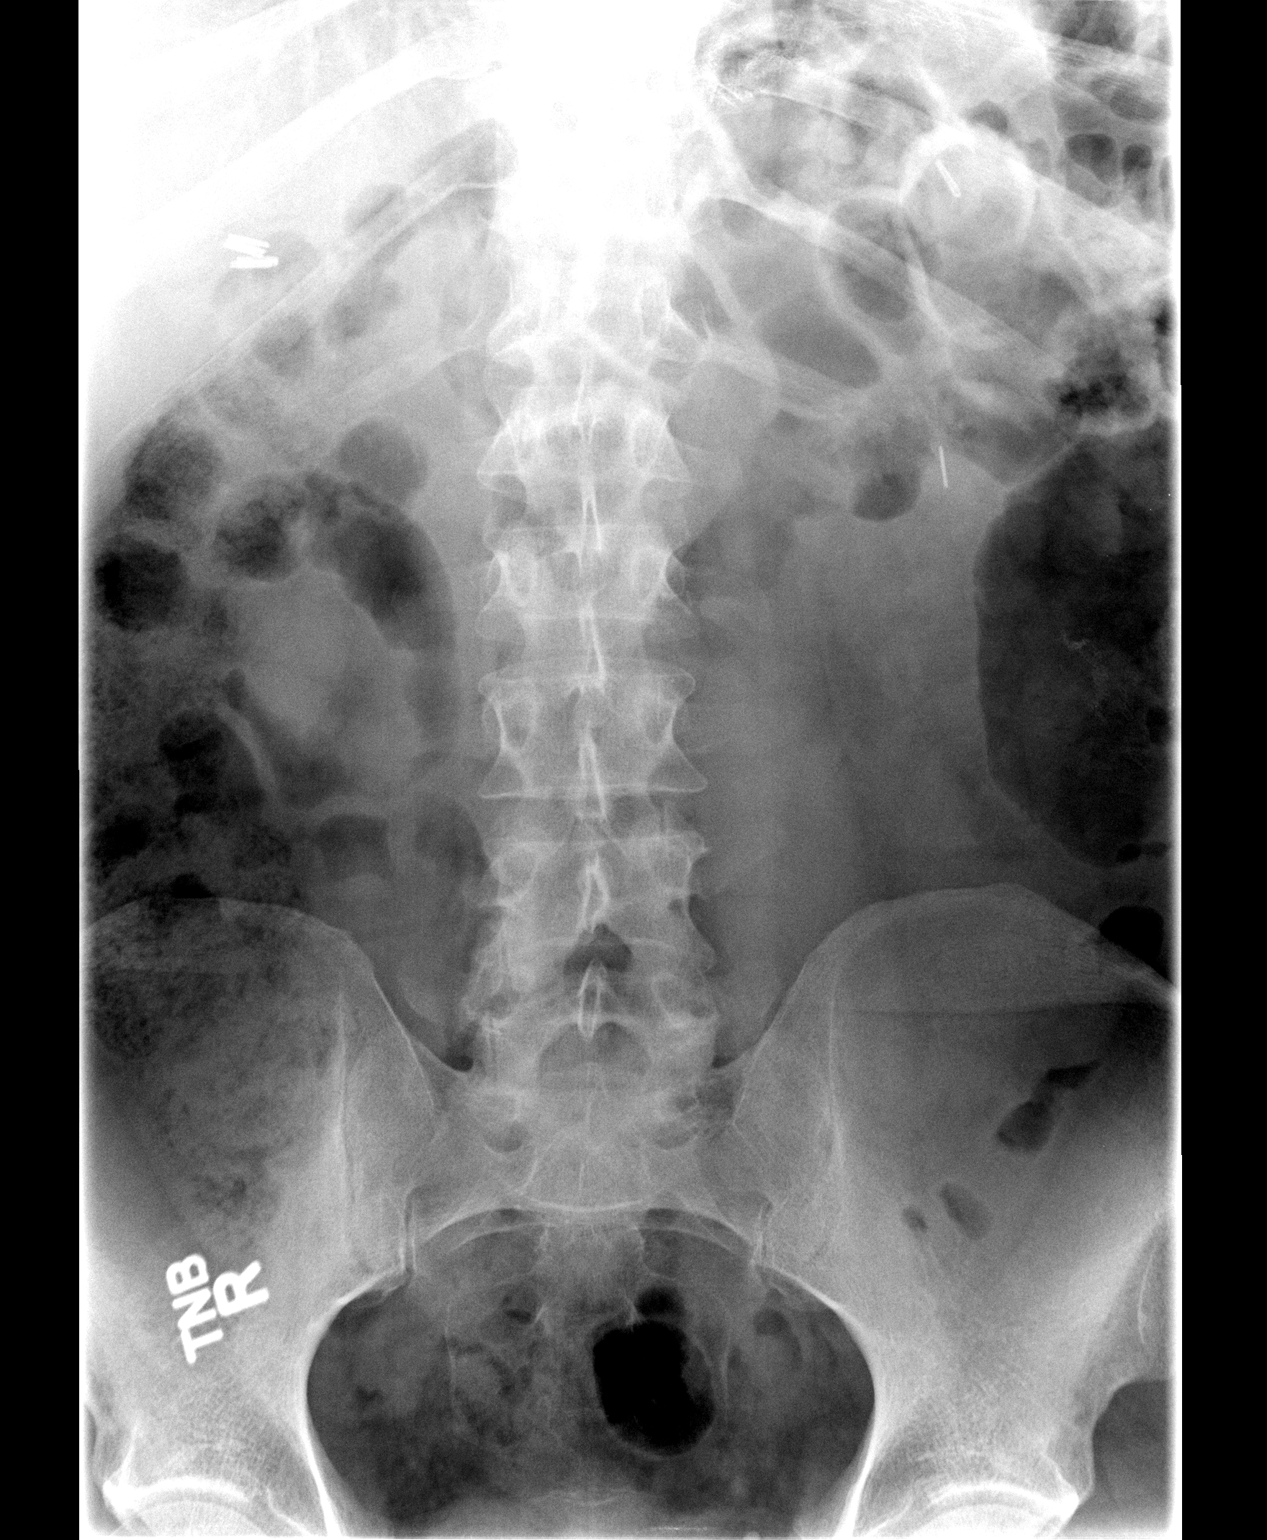

[view not recorded (2 of 3)]
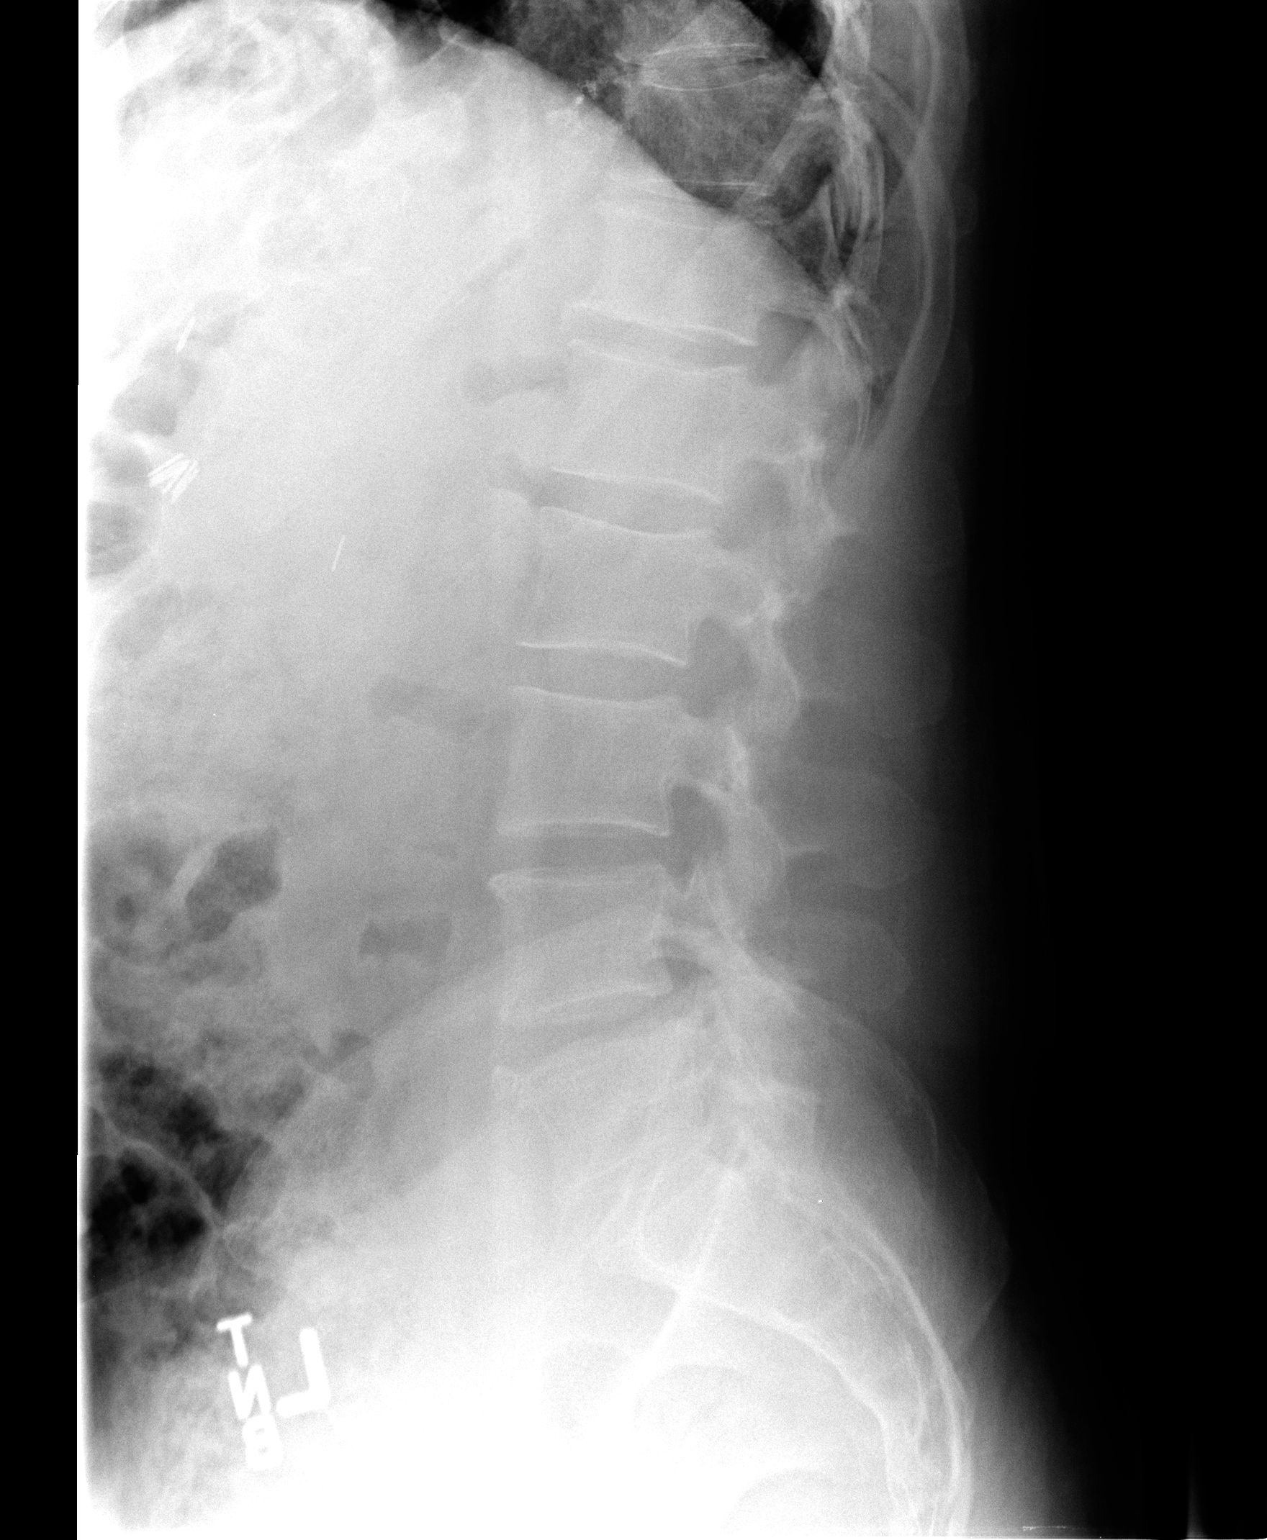

[view not recorded (3 of 3)]
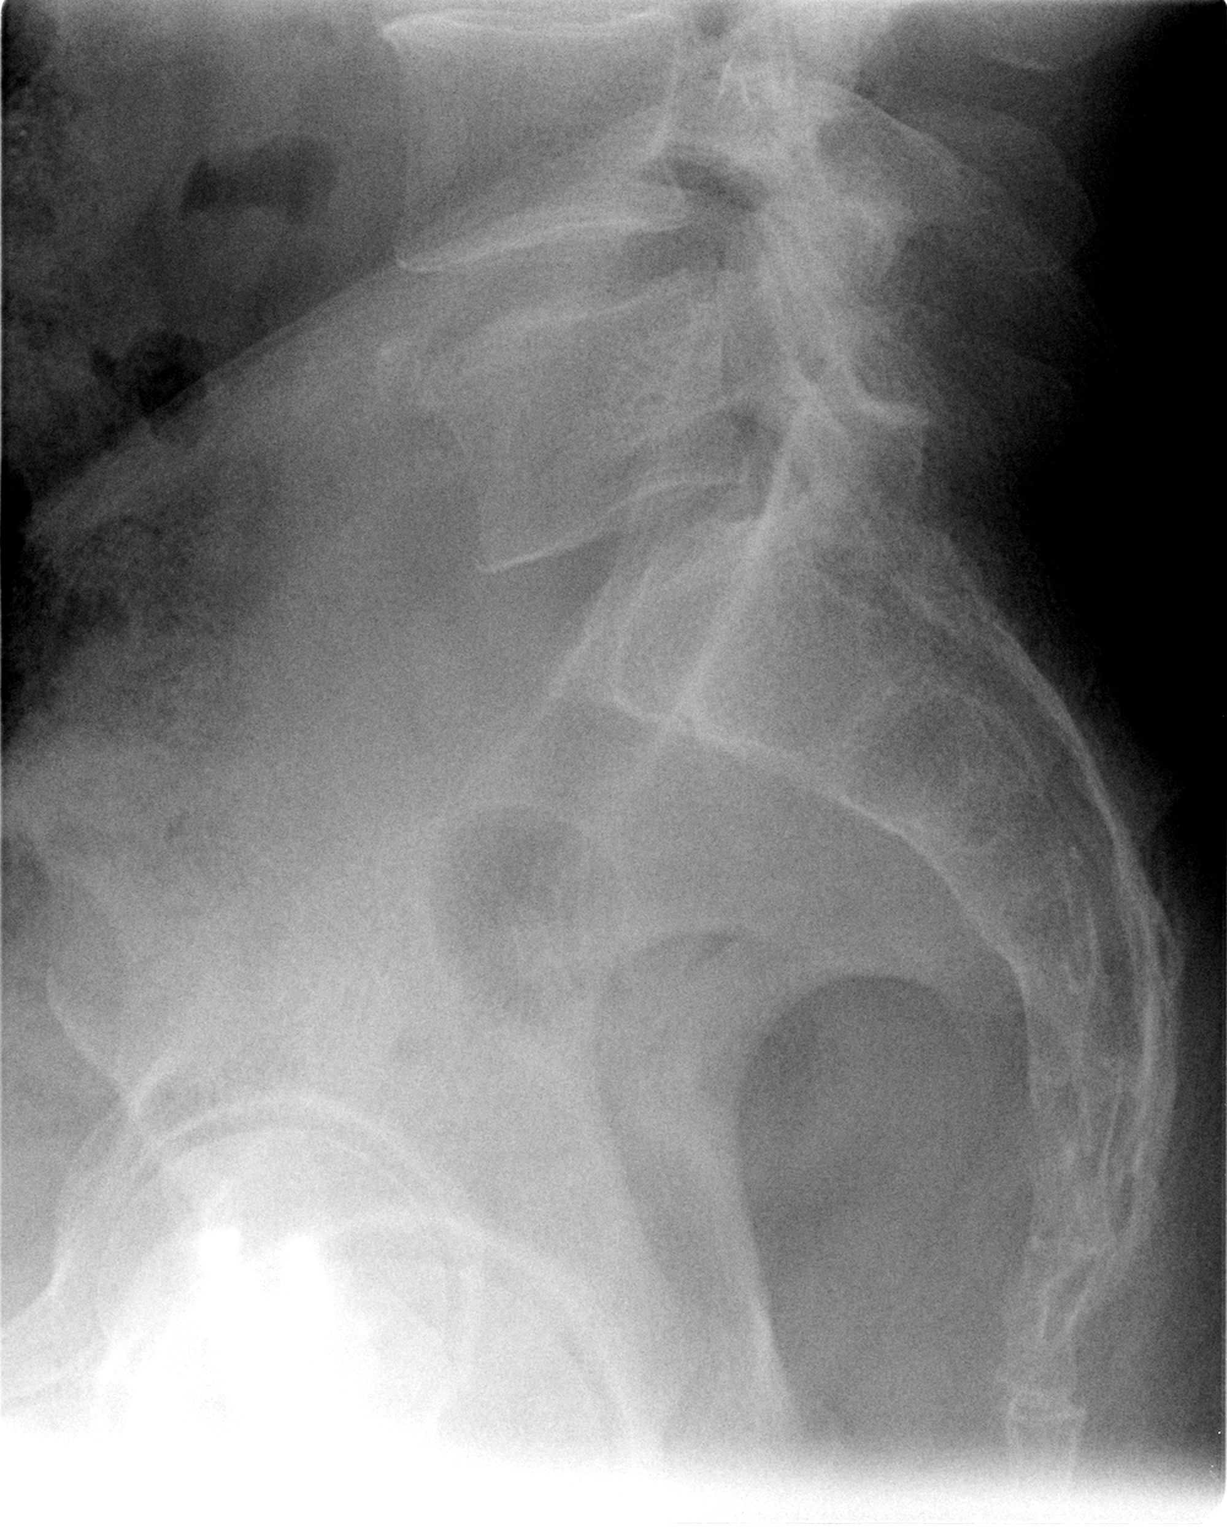

[3 of 3 positions shown; findings below may reference images not displayed]

FINDINGS: Five non-rib-bearing lumbar-type vertebra. Slight straightening of
the lumbar spine.

Very mild L4-5 disc space narrowing.

Small spur anterior superior margin L5 unchanged.

Surgical clips upper abdomen bilaterally.
IMPRESSION: Five non-rib-bearing lumbar-type vertebra. Slight straightening of
the lumbar spine.

Very mild L4-5 disc space narrowing.

## 2015-08-27 ENCOUNTER — Telehealth: Payer: Self-pay

## 2015-08-27 NOTE — Telephone Encounter (Signed)
PATIENT CALLED TO SCHEDULE TCS   49, NO PROBLEMS    [phone]

## 2015-08-27 NOTE — Telephone Encounter (Signed)
See separate triage.  

## 2015-09-09 NOTE — Telephone Encounter (Signed)
SUPREP SPLIT DOSING- FULL LIQUIDS WITH BREAKFAST   HOLD IRON FOR 7 DAYS.  Full Liquid Diet A high-calorie, high-protein supplement should be used to meet your nutritional requirements when the full liquid diet is continued for more than 2 or 3 days. If this diet is to be used for an extended period of time (more than 7 days), a multivitamin should be considered.  Breads and Starches  Allowed: None are allowed except crackersWHOLE OR pureed (made into a thick, smooth soup) in soup.   Avoid: Any others.    Potatoes/Pasta/Rice  Allowed: ANY ITEM AS A SOUP OR SMALL PLATE OF MASHED POTATOES OR SCRAMBLED EGGS.       Vegetables  Allowed: Strained tomato or vegetable juice. Vegetables pureed in soup.   Avoid: Any others.    Fruit  Allowed: Any strained fruit juices and fruit drinks. Include 1 serving of citrus or vitamin C-enriched fruit juice daily.   Avoid: Any others.  Meat and Meat Substitutes  Allowed: Egg  Avoid: Any meat, fish, or fowl. All cheese.  Milk  Allowed: SOY Milk beverages, including milk shakes and instant breakfast mixes. Smooth yogurt.   Avoid: Any others. Avoid dairy products if not tolerated.    Soups and Combination Foods  Allowed: Broth, strained cream soups. Strained, broth-based soups.   Avoid: Any others.    Desserts and Sweets  Allowed: flavored gelatin, tapioca, ice cream, sherbet, smooth pudding, junket, fruit ices, frozen ice pops, pudding pops, frozen fudge pops, chocolate syrup. Sugar, honey, jelly, syrup.   Avoid: Any others.  Fats and Oils  Allowed: Margarine, butter, cream, sour cream, oils.   Avoid: Any others.  Beverages  Allowed: All.   Avoid: None.  Condiments  Allowed: Iodized salt, pepper, spices, flavorings. Cocoa powder.   Avoid: Any others.    SAMPLE MEAL PLAN Breakfast   cup orange juice.   1 OR 2 EGGS  1 cup milk.   1 cup beverage (coffee or tea).   Cream or sugar, if desired.    Midmorning  Snack  2 SCRAMBLED OR HARD BOILED EGG   Lunch  1 cup cream soup.    cup fruit juice.   1 cup milk.    cup custard.   1 cup beverage (coffee or tea).   Cream or sugar, if desired.    Midafternoon Snack  1 cup milk shake.  Dinner  1 cup cream soup.    cup fruit juice.   1 cup MILK    cup pudding.   1 cup beverage (coffee or tea).   Cream or sugar, if desired.  Evening Snack  1 cup supplement.  To increase calories, add sugar, cream, butter, or margarine if possible. Nutritional supplements will also increase the total calories.

## 2015-09-09 NOTE — Telephone Encounter (Signed)
Gastroenterology Pre-Procedure Review  Request Date: 09/03/2015 Requesting Physician: Dr. Hilma Favors is PCP/ Did not refer PATIENT REVIEW QUESTIONS: The patient responded to the following health history questions as indicated:    1. Diabetes Melitis: no 2. Joint replacements in the past 12 months: no 3. Major health problems in the past 3 months: no 4. Has an artificial valve or MVP: no 5. Has a defibrillator: no 6. Has been advised in past to take antibiotics in advance of a procedure like teeth cleaning: no 7. Family history of colon cancer: no  8. Alcohol Use: no 9. History of sleep apnea: no     MEDICATIONS & ALLERGIES:    Patient reports the following regarding taking any blood thinners:   Plavix? no Aspirin? no Coumadin? no  Patient confirms/reports the following medications:  Current Outpatient Prescriptions  Medication Sig Dispense Refill  . cyclobenzaprine (FLEXERIL) 5 MG tablet Take 5 mg by mouth 3 (three) times daily as needed for muscle spasms. Pt takes one to three times weekly at bedtime to relax his back    . butalbital-aspirin-caffeine (FIORINAL) 50-325-40 MG per capsule Take 1-2 capsules by mouth every 6 (six) hours as needed. For migraines    . calcium carbonate (OS-CAL) 600 MG TABS Take 600 mg by mouth 3 (three) times daily with meals.    . Cyanocobalamin (B-12-SL) 1000 MCG SUBL Place 1 tablet under the tongue daily.     . Ferrous Fumarate (IRON) 18 MG TBCR Take 1 tablet by mouth daily.    . Multiple Vitamin (MULITIVITAMIN WITH MINERALS) TABS Take 1 tablet by mouth daily.    . ondansetron (ZOFRAN) 4 MG tablet Take 1 tablet (4 mg total) by mouth every 6 (six) hours. (Patient not taking: Reported on 09/09/2015) 12 tablet 0  . traMADol (ULTRAM) 50 MG tablet Take 50 mg by mouth every 6 (six) hours as needed. For migraines     No current facility-administered medications for this visit.    Patient confirms/reports the following allergies:  Allergies  Allergen  Reactions  . Codeine Itching    No orders of the defined types were placed in this encounter.    AUTHORIZATION INFORMATION Primary Insurance:   ID #:  Group #:  Pre-Cert / Auth required:  Pre-Cert / Auth #:   Secondary Insurance:  ID #:   Group #:  Pre-Cert / Auth required:  Pre-Cert / Auth #:   SCHEDULE INFORMATION: Procedure has been scheduled as follows:  Date:  09/16/2015                  Time:  9:30 AM Location: [facility] Short Stay  This Gastroenterology Pre-Precedure Review Form is being routed to the following provider(s): Barney Drain, MD

## 2015-09-10 MED ORDER — NA SULFATE-K SULFATE-MG SULF 17.5-3.13-1.6 GM/177ML PO SOLN: 1.0000 | ORAL | Status: DC

## 2015-09-10 NOTE — Telephone Encounter (Signed)
I called pt to tell him to stop taking the iron, but he is not taking iron now. Rx sent to pharmacy and instructions mailed to pt.

## 2015-09-14 ENCOUNTER — Other Ambulatory Visit: Payer: Self-pay

## 2015-09-14 DIAGNOSIS — Z1211 Encounter for screening for malignant neoplasm of colon: Secondary | ICD-10-CM

## 2015-09-14 NOTE — Telephone Encounter (Signed)
Instructions faxed to [facility].

## 2015-09-16 ENCOUNTER — Ambulatory Visit (HOSPITAL_COMMUNITY)
Admission: RE | Admit: 2015-09-16 | Discharge: 2015-09-16 | Disposition: A | Discharge status: Home / Self Care | Payer: 59 | Source: Ambulatory Visit | Attending: Gastroenterology | Admitting: Gastroenterology

## 2015-09-16 ENCOUNTER — Encounter (HOSPITAL_COMMUNITY): Payer: Self-pay | Admitting: *Deleted

## 2015-09-16 ENCOUNTER — Encounter (HOSPITAL_COMMUNITY)
Admission: RE | Disposition: A | Discharge status: Home / Self Care | Payer: Self-pay | Source: Ambulatory Visit | Attending: Gastroenterology

## 2015-09-16 ENCOUNTER — Telehealth: Payer: Self-pay

## 2015-09-16 DIAGNOSIS — D120 Benign neoplasm of cecum: Secondary | ICD-10-CM | POA: Insufficient documentation

## 2015-09-16 DIAGNOSIS — K648 Other hemorrhoids: Secondary | ICD-10-CM | POA: Diagnosis not present

## 2015-09-16 DIAGNOSIS — D122 Benign neoplasm of ascending colon: Secondary | ICD-10-CM | POA: Diagnosis not present

## 2015-09-16 DIAGNOSIS — Z9884 Bariatric surgery status: Secondary | ICD-10-CM | POA: Diagnosis not present

## 2015-09-16 DIAGNOSIS — K635 Polyp of colon: Secondary | ICD-10-CM

## 2015-09-16 DIAGNOSIS — Z1211 Encounter for screening for malignant neoplasm of colon: Secondary | ICD-10-CM | POA: Insufficient documentation

## 2015-09-16 HISTORY — PX: COLONOSCOPY: SHX5424

## 2015-09-16 SURGERY — COLONOSCOPY
Anesthesia: Moderate Sedation

## 2015-09-16 MED ORDER — SODIUM CHLORIDE 0.9 % IV SOLN
INTRAVENOUS | Status: DC
Start: 2015-09-16 — End: 2015-09-16
  Administered 2015-09-16: 1000 mL via INTRAVENOUS

## 2015-09-16 MED ORDER — MIDAZOLAM HCL 5 MG/5ML IJ SOLN
INTRAMUSCULAR | Status: DC | PRN
  Administered 2015-09-16: 2 mg via INTRAVENOUS
  Administered 2015-09-16: 1 mg via INTRAVENOUS
  Administered 2015-09-16: 2 mg via INTRAVENOUS
  Administered 2015-09-16: 1 mg via INTRAVENOUS

## 2015-09-16 MED ORDER — MEPERIDINE HCL 100 MG/ML IJ SOLN
INTRAMUSCULAR | Status: DC | PRN
  Administered 2015-09-16: 50 mg via INTRAVENOUS
  Administered 2015-09-16 (×2): 25 mg via INTRAVENOUS

## 2015-09-16 MED ORDER — MEPERIDINE HCL 100 MG/ML IJ SOLN
INTRAMUSCULAR | Status: AC
  Filled 2015-09-16: qty 2

## 2015-09-16 MED ORDER — STERILE WATER FOR IRRIGATION IR SOLN
Status: DC | PRN
  Administered 2015-09-16: 10:00:00

## 2015-09-16 MED ORDER — MIDAZOLAM HCL 5 MG/5ML IJ SOLN
INTRAMUSCULAR | Status: AC
  Filled 2015-09-16: qty 10

## 2015-09-16 NOTE — H&P (Signed)
  Primary Care Physician:  Purvis Kilts, MD Primary Gastroenterologist:  Dr. Oneida Alar  Pre-Procedure History & Physical: HPI:  [name] [name] is a 49 y.o. male here for COLON CANCER SCREENING.  Past Medical History  Diagnosis Date  . Migraines   . Renal disorder     Kidney stones    Past Surgical History  Procedure Laterality Date  . Gastric bypass    . Cholecystectomy    . Hernia repair      Prior to Admission medications   Medication Sig Start Date End Date Taking? Authorizing Provider  cyclobenzaprine (FLEXERIL) 5 MG tablet Take 5 mg by mouth 3 (three) times daily as needed for muscle spasms. Pt takes one to three times weekly at bedtime to relax his back   Yes Historical Provider, MD  Na Sulfate-K Sulfate-Mg Sulf (SUPREP BOWEL PREP) SOLN Take 1 kit by mouth as directed. 09/10/15  Yes Danie Binder, MD  butalbital-aspirin-caffeine [facility]) 50-325-40 MG per capsule Take 1-2 capsules by mouth every 6 (six) hours as needed. For migraines    Historical Provider, MD  traMADol (ULTRAM) 50 MG tablet Take 50 mg by mouth every 6 (six) hours as needed. For migraines    Historical Provider, MD    Allergies as of 09/14/2015 - Review Complete 09/03/2015  Allergen Reaction Noted  . Codeine Itching 01/30/2012    History reviewed. No pertinent family history.  Social History   Social History  . Marital Status: Married    Spouse Name: N/A  . Number of Children: N/A  . Years of Education: N/A   Occupational History  . Not on file.   Social History Main Topics  . Smoking status: Never Smoker   . Smokeless tobacco: Not on file  . Alcohol Use: Yes     Comment: 1 beer per night  . Drug Use: No  . Sexual Activity: Not on file   Other Topics Concern  . Not on file   Social History Narrative    Review of Systems: See HPI, otherwise negative ROS   Physical Exam: BP 144/92 mmHg  Pulse 56  Resp 13  Ht 5' 7" (1.702 m)  Wt 192 lb (87.091 kg)  BMI 30.06 kg/m2   SpO2 100% General:   Alert,  pleasant and cooperative in NAD Head:  Normocephalic and atraumatic. Neck:  Supple; Lungs:  Clear throughout to auscultation.    Heart:  Regular rate and rhythm. Abdomen:  Soft, nontender and nondistended. Normal bowel sounds, without guarding, and without rebound.   Neurologic:  Alert and  oriented x4;  grossly normal neurologically.  Impression/Plan:     SCREENING  Plan:  1. TCS TODAY

## 2015-09-16 NOTE — Op Note (Addendum)
[facility] [address] [place], [address]   COLONOSCOPY PROCEDURE REPORT  PATIENT: [name] [name]  MR#: [id number] BIRTHDATE: [birth year] , 5  yrs. old GENDER: male ENDOSCOPIST: Danie Binder, MD REFERRED XA:JOIN Hilma Favors, M.D. PROCEDURE DATE:  09/17/15 PROCEDURE:   Colonoscopy with snare polypectomy and Colonoscopy with biopsy INDICATIONS:average risk patient for colon cancer.  HAD GASTRIC BYPASS. MEDICATIONS: Meperidine (Demerol) 125 mg IV and Versed 6 mg IV  DESCRIPTION OF PROCEDURE:    Physical exam was performed.  Informed consent was obtained from the patient after explaining the benefits, risks, and alternatives to procedure.  The patient was connected to monitor and placed in left lateral position. Continuous oxygen was provided by nasal cannula and IV medicine administered through an indwelling cannula.  After administration of sedation and rectal exam, the patients rectum was intubated and the EC-[id number] ([id number])  colonoscope was advanced under direct visualization to the cecum.  The scope was removed slowly by carefully examining the color, texture, anatomy, and integrity mucosa on the way out.  The patient was recovered in endoscopy and discharged home in satisfactory condition. Estimated blood loss is zero unless otherwise noted in this procedure report.    COLON FINDINGS: A sessile polyp ranging from 6 to 65mm in size was found at the cecum.  A polypectomy was performed using snare cautery. One 3 mm sessile cecal polyp removed via cold forceps. , Submucosal nodule with pillow texture located in MID-TRANSVERSE COLON & biopsied via cold forceps, and Moderate sized internal hemorrhoids were found.  PREP QUALITY: excellent CCAL W/D TIME: 16       minutes COMPLICATIONS: None  ENDOSCOPIC IMPRESSION: 1.   TWO COLON POLYPS REMOVED 2.  POSSIBLE LIPOMA IN MID-TRANSVERSE COLON 3.   Moderate sized internal hemorrhoids  RECOMMENDATIONS: FOLLOW  A HIGH FIBER DIET. AWAIT BIOPSY RESULTS. Next colonoscopy in 5-10 years.     _______________________________ Lorrin MaisDanie Binder, MD September 17, 2015 6:35 PM Revised: 09/17/2015 6:35 PM   CPT CODES: ICD CODES:  The ICD and CPT codes recommended by this software are interpretations from the data that the clinical staff has captured with the software.  The verification of the translation of this report to the ICD and CPT codes and modifiers is the sole responsibility of the health care institution and practicing physician where this report was generated.  [employer] will not be held responsible for the validity of the ICD and CPT codes included on this report.  AMA assumes no liability for data contained or not contained herein. CPT is a Designer, television/film set of the [employer].

## 2015-09-16 NOTE — Telephone Encounter (Signed)
[name] [name] went on line to do PA for this colonoscopy. PA Reference number is [id number].

## 2015-09-16 NOTE — Discharge Instructions (Signed)
You had 2 polyps removed. You have internal hemorrhoids and diverticulosis IN YOUR  LEFT COLON.   FOLLOW A HIGH FIBER DIET. AVOID ITEMS THAT CAUSE BLOATING. SEE INFO BELOW.  YOUR BIOPSY RESULTS WILL BE AVAILABLE IN MY CHART AFTER NOV 3 AND MY OFFICE WILL CONTACT YOU IN 10-14 DAYS WITH YOUR RESULTS.   Next colonoscopy in 5-10 years.   Colonoscopy Care After Read the instructions outlined below and refer to this sheet in the next week. These discharge instructions provide you with general information on caring for yourself after you leave the hospital. While your treatment has been planned according to the most current medical practices available, unavoidable complications occasionally occur. If you have any problems or questions after discharge, call DR. Alicha Raspberry, [phone].  ACTIVITY  You may resume your regular activity, but move at a slower pace for the next 24 hours.   Take frequent rest periods for the next 24 hours.   Walking will help get rid of the air and reduce the bloated feeling in your belly (abdomen).   No driving for 24 hours (because of the medicine (anesthesia) used during the test).   You may shower.   Do not sign any important legal documents or operate any machinery for 24 hours (because of the anesthesia used during the test).    NUTRITION  Drink plenty of fluids.   You may resume your normal diet as instructed by your doctor.   Begin with a light meal and progress to your normal diet. Heavy or fried foods are harder to digest and may make you feel sick to your stomach (nauseated).   Avoid alcoholic beverages for 24 hours or as instructed.    MEDICATIONS  You may resume your normal medications.   WHAT YOU CAN EXPECT TODAY  Some feelings of bloating in the abdomen.   Passage of more gas than usual.   Spotting of blood in your stool or on the toilet paper  .  IF YOU HAD POLYPS REMOVED DURING THE COLONOSCOPY:  Eat a soft diet IF YOU HAVE  NAUSEA, BLOATING, ABDOMINAL PAIN, OR VOMITING.    FINDING OUT THE RESULTS OF YOUR TEST Not all test results are available during your visit. DR. Oneida Alar WILL CALL YOU WITHIN 14 DAYS OF YOUR PROCEDUE WITH YOUR RESULTS. Do not assume everything is normal if you have not heard from DR. Takhia Spoon, CALL HER OFFICE AT [phone].  SEEK IMMEDIATE MEDICAL ATTENTION AND CALL THE OFFICE: [phone] IF:  You have more than a spotting of blood in your stool.   Your belly is swollen (abdominal distention).   You are nauseated or vomiting.   You have a temperature over 101F.   You have abdominal pain or discomfort that is severe or gets worse throughout the day.  Polyps, Colon  A polyp is extra tissue that grows inside your body. Colon polyps grow in the large intestine. The large intestine, also called the colon, is part of your digestive system. It is a long, hollow tube at the end of your digestive tract where your body makes and stores stool. Most polyps are not dangerous. They are benign. This means they are not cancerous. But over time, some types of polyps can turn into cancer. Polyps that are smaller than a pea are usually not harmful. But larger polyps could someday become or may already be cancerous. To be safe, doctors remove all polyps and test them.   PREVENTION There is not one sure way to prevent polyps.  You might be able to lower your risk of getting them if you:  Eat more fruits and vegetables and less fatty food.   Do not smoke.   Avoid alcohol.   Exercise every day.   Lose weight if you are overweight.   Eating more calcium and folate can also lower your risk of getting polyps. Some foods that are rich in calcium are milk, cheese, and broccoli. Some foods that are rich in folate are chickpeas, kidney beans, and spinach.   High-Fiber Diet A high-fiber diet changes your normal diet to include more whole grains, legumes, fruits, and vegetables. Changes in the diet involve  replacing refined carbohydrates with unrefined foods. The calorie level of the diet is essentially unchanged. The Dietary Reference Intake (recommended amount) for adult males is 38 grams per day. For adult females, it is 25 grams per day. Pregnant and lactating women should consume 28 grams of fiber per day. Fiber is the intact part of a plant that is not broken down during digestion. Functional fiber is fiber that has been isolated from the plant to provide a beneficial effect in the body. PURPOSE  Increase stool bulk.   Ease and regulate bowel movements.   Lower cholesterol.  REDUCE RISK OF COLON CANCER  INDICATIONS THAT YOU NEED MORE FIBER  Constipation and hemorrhoids.   Uncomplicated diverticulosis (intestine condition) and irritable bowel syndrome.   Weight management.   As a protective measure against hardening of the arteries (atherosclerosis), diabetes, and cancer.   GUIDELINES FOR INCREASING FIBER IN THE DIET  Start adding fiber to the diet slowly. A gradual increase of about 5 more grams (2 slices of whole-wheat bread, 2 servings of most fruits or vegetables, or 1 bowl of high-fiber cereal) per day is best. Too rapid an increase in fiber may result in constipation, flatulence, and bloating.   Drink enough water and fluids to keep your urine clear or pale yellow. Water, juice, or caffeine-free drinks are recommended. Not drinking enough fluid may cause constipation.   Eat a variety of high-fiber foods rather than one type of fiber.   Try to increase your intake of fiber through using high-fiber foods rather than fiber pills or supplements that contain small amounts of fiber.   The goal is to change the types of food eaten. Do not supplement your present diet with high-fiber foods, but replace foods in your present diet.   INCLUDE A VARIETY OF FIBER SOURCES  Replace refined and processed grains with whole grains, canned fruits with fresh fruits, and incorporate other  fiber sources. White rice, white breads, and most bakery goods contain little or no fiber.   Brown whole-grain rice, buckwheat oats, and many fruits and vegetables are all good sources of fiber. These include: broccoli, Brussels sprouts, cabbage, cauliflower, beets, sweet potatoes, white potatoes (skin on), carrots, tomatoes, eggplant, squash, berries, fresh fruits, and dried fruits.   Cereals appear to be the richest source of fiber. Cereal fiber is found in whole grains and bran. Bran is the fiber-rich outer coat of cereal grain, which is largely removed in refining. In whole-grain cereals, the bran remains. In breakfast cereals, the largest amount of fiber is found in those with "bran" in their names. The fiber content is sometimes indicated on the label.   You may need to include additional fruits and vegetables each day.   In baking, for 1 cup white flour, you may use the following substitutions:   1 cup whole-wheat flour minus 2 tablespoons.  1/2 cup white flour plus 1/2 cup whole-wheat flour.   Diverticulosis Diverticulosis is a common condition that develops when small pouches (diverticula) form in the wall of the colon. The risk of diverticulosis increases with age. It happens more often in people who eat a low-fiber diet. Most individuals with diverticulosis have no symptoms. Those individuals with symptoms usually experience belly (abdominal) pain, constipation, or loose stools (diarrhea).  HOME CARE INSTRUCTIONS  Increase the amount of fiber in your diet as directed by your caregiver or dietician. This may reduce symptoms of diverticulosis.   Drink at least 6 to 8 glasses of water each day to prevent constipation.   Try not to strain when you have a bowel movement.   Avoiding nuts and seeds to prevent complications is NOT NECESSARY.   FOODS HAVING HIGH FIBER CONTENT INCLUDE:  Fruits. Apple, peach, pear, tangerine, raisins, prunes.   Vegetables. Brussels sprouts, asparagus,  broccoli, cabbage, carrot, cauliflower, romaine lettuce, spinach, summer squash, tomato, winter squash, zucchini.   Starchy Vegetables. Baked beans, kidney beans, lima beans, split peas, lentils, potatoes (with skin).   Grains. Whole wheat bread, brown rice, bran flake cereal, plain oatmeal, white rice, shredded wheat, bran muffins.   SEEK IMMEDIATE MEDICAL CARE IF:  You develop increasing pain or severe bloating.   You have an oral temperature above 101F.   You develop vomiting or bowel movements that are bloody or black.   Hemorrhoids Hemorrhoids are dilated (enlarged) veins around the rectum. Sometimes clots will form in the veins. This makes them swollen and painful. These are called thrombosed hemorrhoids. Causes of hemorrhoids include:  Constipation.   Straining to have a bowel movement.   HEAVY LIFTING  HOME CARE INSTRUCTIONS  Eat a well balanced diet and drink 6 to 8 glasses of water every day to avoid constipation. You may also use a bulk laxative.   Avoid straining to have bowel movements.   Keep anal area dry and clean.   Do not use a donut shaped pillow or sit on the toilet for long periods. This increases blood pooling and pain.   Move your bowels when your body has the urge; this will require less straining and will decrease pain and pressure.

## 2015-09-22 ENCOUNTER — Encounter (HOSPITAL_COMMUNITY): Payer: Self-pay | Admitting: Gastroenterology

## 2015-09-26 ENCOUNTER — Encounter (HOSPITAL_COMMUNITY): Payer: Self-pay | Admitting: Gastroenterology

## 2015-09-26 ENCOUNTER — Telehealth: Payer: Self-pay | Admitting: Gastroenterology

## 2015-09-26 NOTE — Telephone Encounter (Signed)
Please call pt. He had A simple adenoma  AND ONE HYPERPLASTIC POLYPS removed.   FOLLOW A HIGH FIBER DIET. AVOID ITEMS THAT CAUSE BLOATING.   Next colonoscopy in 5-10 years.

## 2015-09-28 NOTE — Telephone Encounter (Signed)
Reminder in epic °

## 2015-09-28 NOTE — Telephone Encounter (Signed)
Pt is aware of results. 

## 2020-09-15 ENCOUNTER — Encounter: Payer: Self-pay | Admitting: Internal Medicine
